# Patient Record
Sex: Female | Born: 1985 | Race: White | Hispanic: No | Marital: Married
Health system: Midwestern US, Community
[De-identification: ages and names within clinical notes are randomized; demographics above are authoritative.]

## PROBLEM LIST (undated history)

## (undated) DIAGNOSIS — K812 Acute cholecystitis with chronic cholecystitis: Secondary | ICD-10-CM

## (undated) DIAGNOSIS — F1910 Other psychoactive substance abuse, uncomplicated: Secondary | ICD-10-CM

## (undated) DIAGNOSIS — G43909 Migraine, unspecified, not intractable, without status migrainosus: Secondary | ICD-10-CM

## (undated) DIAGNOSIS — F329 Major depressive disorder, single episode, unspecified: Secondary | ICD-10-CM

## (undated) DIAGNOSIS — F909 Attention-deficit hyperactivity disorder, unspecified type: Secondary | ICD-10-CM

## (undated) DIAGNOSIS — F32A Depression, unspecified: Secondary | ICD-10-CM

## (undated) HISTORY — PX: TONSILLECTOMY: SUR1361

## (undated) HISTORY — PX: TUBAL LIGATION: SHX77

---
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
## 2004-01-04 ENCOUNTER — Emergency Department (HOSPITAL_COMMUNITY): Admission: EM | Admit: 2004-01-04 | Discharge: 2004-01-04 | Payer: Self-pay | Admitting: Emergency Medicine

## 2004-06-04 ENCOUNTER — Other Ambulatory Visit: Admission: RE | Admit: 2004-06-04 | Discharge: 2004-06-04 | Payer: Self-pay | Admitting: Obstetrics & Gynecology

## 2004-06-10 ENCOUNTER — Emergency Department (HOSPITAL_COMMUNITY): Admission: EM | Admit: 2004-06-10 | Discharge: 2004-06-10 | Payer: Self-pay | Admitting: Emergency Medicine

## 2004-11-13 ENCOUNTER — Other Ambulatory Visit: Admission: RE | Admit: 2004-11-13 | Discharge: 2004-11-13 | Payer: Self-pay | Admitting: Obstetrics & Gynecology

## 2005-01-06 ENCOUNTER — Inpatient Hospital Stay (HOSPITAL_COMMUNITY): Admission: AD | Admit: 2005-01-06 | Discharge: 2005-01-06 | Payer: Self-pay | Admitting: Obstetrics and Gynecology

## 2005-01-30 ENCOUNTER — Inpatient Hospital Stay (HOSPITAL_COMMUNITY): Admission: AD | Admit: 2005-01-30 | Discharge: 2005-01-30 | Payer: Self-pay | Admitting: Obstetrics and Gynecology

## 2005-04-05 ENCOUNTER — Inpatient Hospital Stay (HOSPITAL_COMMUNITY): Admission: AD | Admit: 2005-04-05 | Discharge: 2005-04-07 | Payer: Self-pay | Admitting: Obstetrics & Gynecology

## 2005-04-09 ENCOUNTER — Other Ambulatory Visit: Admission: RE | Admit: 2005-04-09 | Discharge: 2005-04-09 | Payer: Self-pay | Admitting: Obstetrics & Gynecology

## 2005-04-30 ENCOUNTER — Inpatient Hospital Stay (HOSPITAL_COMMUNITY): Admission: AD | Admit: 2005-04-30 | Discharge: 2005-04-30 | Payer: Self-pay | Admitting: Obstetrics & Gynecology

## 2005-05-19 ENCOUNTER — Observation Stay (HOSPITAL_COMMUNITY): Admission: AD | Admit: 2005-05-19 | Discharge: 2005-05-20 | Payer: Self-pay | Admitting: Obstetrics and Gynecology

## 2005-05-29 ENCOUNTER — Observation Stay (HOSPITAL_COMMUNITY): Admission: AD | Admit: 2005-05-29 | Discharge: 2005-05-30 | Payer: Self-pay | Admitting: Obstetrics & Gynecology

## 2005-06-06 ENCOUNTER — Inpatient Hospital Stay (HOSPITAL_COMMUNITY): Admission: AD | Admit: 2005-06-06 | Discharge: 2005-06-06 | Payer: Self-pay | Admitting: Obstetrics and Gynecology

## 2005-06-06 ENCOUNTER — Inpatient Hospital Stay (HOSPITAL_COMMUNITY): Admission: AD | Admit: 2005-06-06 | Discharge: 2005-06-07 | Payer: Self-pay | Admitting: Obstetrics and Gynecology

## 2005-06-13 ENCOUNTER — Inpatient Hospital Stay (HOSPITAL_COMMUNITY): Admission: AD | Admit: 2005-06-13 | Discharge: 2005-06-16 | Payer: Self-pay | Admitting: Obstetrics & Gynecology

## 2006-04-20 ENCOUNTER — Inpatient Hospital Stay (HOSPITAL_COMMUNITY): Admission: AD | Admit: 2006-04-20 | Discharge: 2006-04-20 | Payer: Self-pay | Admitting: Gynecology

## 2006-05-07 ENCOUNTER — Ambulatory Visit (HOSPITAL_COMMUNITY): Admission: RE | Admit: 2006-05-07 | Discharge: 2006-05-07 | Payer: Self-pay | Admitting: Obstetrics and Gynecology

## 2009-06-11 ENCOUNTER — Emergency Department (HOSPITAL_COMMUNITY): Admission: EM | Admit: 2009-06-11 | Discharge: 2009-06-11 | Payer: Self-pay | Admitting: Emergency Medicine

## 2009-07-04 ENCOUNTER — Encounter: Admission: RE | Admit: 2009-07-04 | Discharge: 2009-07-04 | Payer: Self-pay | Admitting: General Practice

## 2009-09-13 ENCOUNTER — Emergency Department (HOSPITAL_BASED_OUTPATIENT_CLINIC_OR_DEPARTMENT_OTHER): Admission: EM | Admit: 2009-09-13 | Discharge: 2009-09-13 | Payer: Self-pay | Admitting: Emergency Medicine

## 2009-09-13 ENCOUNTER — Ambulatory Visit: Payer: Self-pay | Admitting: Diagnostic Radiology

## 2009-10-08 ENCOUNTER — Emergency Department (HOSPITAL_BASED_OUTPATIENT_CLINIC_OR_DEPARTMENT_OTHER): Admission: EM | Admit: 2009-10-08 | Discharge: 2009-10-08 | Payer: Self-pay | Admitting: Emergency Medicine

## 2010-04-05 ENCOUNTER — Emergency Department (HOSPITAL_BASED_OUTPATIENT_CLINIC_OR_DEPARTMENT_OTHER)
Admission: EM | Admit: 2010-04-05 | Discharge: 2010-04-05 | Payer: Self-pay | Source: Home / Self Care | Admitting: Emergency Medicine

## 2010-06-24 LAB — DIFFERENTIAL
Basophils Absolute: 0.2 10*3/uL — ABNORMAL HIGH (ref 0.0–0.1)
Basophils Relative: 2 % — ABNORMAL HIGH (ref 0–1)
Lymphs Abs: 2.2 10*3/uL (ref 0.7–4.0)
Monocytes Relative: 5 % (ref 3–12)

## 2010-06-24 LAB — BASIC METABOLIC PANEL
GFR calc Af Amer: >60 mL/min (ref >60)
Potassium: 4.1 mEq/L (ref 3.5–5.1)

## 2010-06-24 LAB — URINALYSIS, ROUTINE W REFLEX MICROSCOPIC
Bilirubin Urine: NEGATIVE
Glucose, UA: NEGATIVE mg/dL
Ketones, ur: NEGATIVE mg/dL
Nitrite: NEGATIVE
Protein, ur: NEGATIVE mg/dL
Specific Gravity, Urine: 1.025 (ref 1.005–1.030)
Urobilinogen, UA: 0.2 mg/dL (ref 0.0–1.0)
pH: 6 (ref 5.0–8.0)

## 2010-06-24 LAB — URINE MICROSCOPIC-ADD ON

## 2010-06-24 LAB — CBC: RDW: 14 % (ref 11.5–15.5)

## 2010-06-24 LAB — HEMOCCULT GUIAC POC 1CARD (OFFICE): Fecal Occult Bld: NEGATIVE

## 2010-07-17 ENCOUNTER — Emergency Department (HOSPITAL_BASED_OUTPATIENT_CLINIC_OR_DEPARTMENT_OTHER)
Admission: EM | Admit: 2010-07-17 | Discharge: 2010-07-17 | Disposition: A | Discharge status: Home / Self Care | Payer: Medicaid Other | Attending: Emergency Medicine | Admitting: Emergency Medicine

## 2010-07-17 DIAGNOSIS — J3489 Other specified disorders of nose and nasal sinuses: Secondary | ICD-10-CM | POA: Insufficient documentation

## 2010-07-17 DIAGNOSIS — J069 Acute upper respiratory infection, unspecified: Secondary | ICD-10-CM | POA: Insufficient documentation

## 2010-07-17 DIAGNOSIS — F17200 Nicotine dependence, unspecified, uncomplicated: Secondary | ICD-10-CM | POA: Insufficient documentation

## 2010-08-23 NOTE — Discharge Summary (Signed)
NAMESANTINA, Tamara Allen                              ACCOUNT NO.:  [id number]   MEDICAL RECORD NO.:  [id number]                   PATIENT TYPE:  INP   LOCATION:  9114                                 FACILITY:  [facility]   PHYSICIAN:  Gerrit Friends. Aldona Bar, M.D.                DATE OF BIRTH:  April 04, 1986   DATE OF ADMISSION:  08/15/2003  DATE OF DISCHARGE:  08/18/2003                                 DISCHARGE SUMMARY   DISCHARGE DIAGNOSES:  1. Term pregnancy delivered 7-pound 8-ounce female infant Apgars 9 and 9.  2. Blood type A positive.   PROCEDURES:  1. Vacuum extracted assisted delivery.  2. Episiotomy repair.   SUMMARY:  This 25 year old primigravida was admitted in labor after an  antenatal course that was complicated by Chlamydia and an abnormal Pap  smear.  She presented in labor, required Pitocin augmentation, amniotomy and  internal monitoring, she received an epidural, she progressed to full  dilatation and during second stage had trouble with pain control as well as  maternal exhaustion and eventually required a vacuum extractor assisted  delivery over a midline episiotomy, Apgars were 9 and 9, and her postpartum  course was benign.  On the morning of Aug 18, 2003 she was ambulating well,  tolerating a regular diet well and having normal bowel and bladder function,  was bottle-feeding without difficulty, vital signs were stable, and she was  deemed ready for discharge.  Discharge hemoglobin 7.8 with a white count  12,300 and a platelet count of 303,000.   DISCHARGE MEDICATIONS:  1. Vitamins - one a day until gone.  2. Ferrous sulfate 300 mg twice daily.  3. Tylox as needed for severe pain.  4. Motrin as needed for less severe pain.   FOLLOW UP:  She will return to the office in follow up approximately 4 weeks  time or as needed.   CONDITION ON DISCHARGE:  Good.                                               Gerrit Friends. Aldona Bar, M.D.    RMW/MEDQ  D:  08/18/2003  T:  08/18/2003  Job:   [id number]

## 2010-08-23 NOTE — Discharge Summary (Signed)
Tamara Allen, Tamara Allen                  ACCOUNT NO.:  [id number]   MEDICAL RECORD NO.:  [id number]          PATIENT TYPE:  INP   LOCATION:  9169                          FACILITY:  [facility]   PHYSICIAN:  Gerrit Friends. Aldona Bar, M.D.   DATE OF BIRTH:  01-Sep-1985   DATE OF ADMISSION:  05/29/2005  DATE OF DISCHARGE:  05/30/2005                                 DISCHARGE SUMMARY   FINAL DIAGNOSES:  1.  Intrauterine pregnancy at 56 weeks' gestation.  2.  Preterm uterine contractions.  3.  Smoker.   The patient admitted for observation.   COMPLICATIONS:  None.   HISTORY:  This 25 year old G3, P1 presents at 52 and 6/7ths weeks gestation  with contractions that had been progressing all day.  She was seen in the  office.  They were about every 7 minutes.  Her cervix was 1-2 cm dilated,  40% effaced at a -2 station.  The patient's antepartum course up to this  point had been complicated by an anxiety depression disorder.  The patient  also is a smoker.  Her Paps were also being watched throughout secondary to  some low-grade changes.  Treatment, if needed, will be done post partum.  The patient has been on Procardia throughout this pregnancy for some preterm  contractions.  She did have a positive group B strep culture back in October  of 2006.  She is admitted at this time.   HOSPITAL COURSE:  She was started on IV fluids, and expectant management was  used.  The patient's contractions stopped.  She has a cervical check in the  morning with no change her cervix, and at this point was felt ready for  discharge.   DISCHARGE DIET:  She was sent home on a regular diet.   DISCHARGE INSTRUCTIONS:  She was told to come back if her contractions were  every 3-5 minutes apart.  Labor precautions were reviewed, and she was to  follow up in our office in the week.   LABORATORIES ON DISCHARGE:  The patient had a hemoglobin of 9.6, white blood  cell count at 15.8 and platelets of 373,000.     Leilani Able,  P.A.-C.      Gerrit Friends. Aldona Bar, M.D.  Electronically Signed   MB/MEDQ  D:  07/12/2005  T:  07/13/2005  Job:  [id number]

## 2010-08-23 NOTE — Discharge Summary (Signed)
Tamara Allen, Tamara Allen                  ACCOUNT NO.:  [id number]   MEDICAL RECORD NO.:  [id number]          PATIENT TYPE:  INP   LOCATION:  9152                          [facility]ACILITY:  [facility]   PHYSICIAN:  Miguel Aschoff, M.D.       DATE OF BIRTH:  [birth year]   DATE OF ADMISSION:  04/05/2005  DATE OF DISCHARGE:  04/07/2005                                 DISCHARGE SUMMARY   ADMISSION DIAGNOSES:  1.  Intrauterine pregnancy at 28 2/7 weeks.  2.  Nausea, vomiting.  3.  Dehydration.  4.  Migraine headache.   FINAL DIAGNOSES:  1.  Intrauterine pregnancy at 28 2/7 weeks.  2.  Nausea, vomiting.  3.  Dehydration.  4.  Migraine headache.   BRIEF HISTORY:  The patient is a 25 year old white female, gravida 3, para  1, 0-1-1 with an estimated date of confinement of June 27, 2005.  The  patient presents to the emergency room on December 30, 23006 complaining of  nausea and vomiting, as well as severe headache.  The patient was evaluated  in the emergency room and was admitted for intravenous hydration and  management of her headache.   HOSPITAL COURSE:  Laboratory studies were obtained, which revealed a  hemoglobin of 9.67, a white count of 12.4.  Chemistry profile was  essentially within normal limits and fetal fibronectin was negative.  Urinalysis was negative also.  The patient was started on intravenous fluids  and Phenergan for nausea and given intravenous narcotic analgesics for her  headache.  By April 07, 2005, she was feeling somewhat better.  The nausea  had resolved and she was stable enough to be discharged home.   MEDICATIONS FOR HOME:  1.  Demerol 50 mg p.o. q.4h. p.r.n. pain.  2.  Phenergan 25 mg q.6h. as needed for nausea.   She was instructed to keep her visit at the office for April 16, 2005 and  to call if there were any problems, such as increasing pain paresthesias,  fever or worsening of the headache.      Miguel Aschoff, M.D.  Electronically Signed     AR/MEDQ  D:   04/27/2005  T:  04/28/2005  Job:  [id number]

## 2010-08-23 NOTE — H&P (Signed)
NAME:  Tamara Allen, Tamara Allen                  ACCOUNT NO.:  [id number]   MEDICAL RECORD NO.:  [id number]          PATIENT TYPE:  AMB   LOCATION:  DAY                           FACILITY:  [facility]   PHYSICIAN:  Tilda Burrow, M.D. DATE OF BIRTH:  [birth year]   DATE OF ADMISSION:  04/28/2006  DATE OF DISCHARGE:  LH                              HISTORY & PHYSICAL   ADMISSION DIAGNOSES:  Elective permanent sterilization, intrauterine  device scheduled for removal.   HISTORY OF PRESENT ILLNESS:  This 25 year old female gravida 3, para 2,  two previous children, presents with her mother for requested elected  permanent sterilization.  She has been requesting it since October 2007.  She was talked into an IUD at that point in time.  Since that time, she  has complained of persistent spotting.  She signed Medicaid tubal  sterilizations at that time.  At this time, she is called back really  requesting tubal sterilization requesting to see the physician.  She  is firmly and strongly convinced that she wants no further children  under any circumstance.  I had a lengthy and calm conversation with Tamara Allen  today, April 21, 2006, to ensure that she has looked at her plans from  all angles, and she is confident in her decision.  We have painted  scenarios in the future that she might not anticipate.  I have given her  my recommendations that she consider delayed sterilization at a later  date with a long-term, nonpermanent method, such as __Mirena or  Implanon____ and at this time, she declines these options.  She is  alert, intelligent, oriented with no obvious stressors at this time, so  we will proceed as per patient request.  Permanency of procedure  emphasized.  Tamara Allen instructional booklet used as Catering manager.   PAST MEDICAL HISTORY:  Benign.   PAST SURGICAL HISTORY:  Negative.   ALLERGIES:  CODEINE CAUSES NAUSEA AND VOMITING.   SOCIAL HISTORY:  Cigarettes:  One pack per day.  Alcohol:  Rare,  social.  Marijuana:  Done.   PAST SURGICAL HISTORY:  1. Tonsillectomy in the past.  2. Wisdom tooth extraction under anesthesia in the dental office.   PHYSICAL EXAMINATION:  VITAL SIGNS:  Weight 171, blood pressure 104/66,  hemoglobin 11.5.  GENERAL:  A healthy appearing, cheerful, Caucasian female, alert and  oriented x3.  HEENT:  Pupils equal, round and reactive to light.  Extraocular  movements intact.  NECK:  Supple.  Normal thyroid.  CHEST:  Clear to auscultation.  LUNGS:  Clear to auscultation.  ABDOMEN:  Moderate obesity.  EXTERNAL GENITALIA:  Normal vaginal exam.  PHYSIOLOGIC:  Secretions, cervix, IUD string in place.  Uterus anteflex,  nontender.  Adnexa without masses or purulence.  Recent GC/Chlamydia  performed prior to IUD insertion.   PLAN:  Laparoscopic tubal sterilization __________January 28, 2008, with  IUD removed at the same anesthesia.  Preop visit 1:00 p.m., May 01, 2006.      Tilda Burrow, M.D.  Electronically Signed     JVF/MEDQ  D:  04/28/2006  T:  04/29/2006  Job:  [id number]

## 2010-08-23 NOTE — Discharge Summary (Signed)
Tamara Allen, Tamara Allen                  ACCOUNT NO.:  [id number]   MEDICAL RECORD NO.:  [id number]          PATIENT TYPE:  INP   LOCATION:  9160                          FACILITY:  [facility]   PHYSICIAN:  Miguel Aschoff, M.D.       DATE OF BIRTH:  January 27, 1986   DATE OF ADMISSION:  05/19/2005  DATE OF DISCHARGE:  05/20/2005                                 DISCHARGE SUMMARY   ADMITTING DIAGNOSES:  1.  Intrauterine pregnancy at 34 weeks with preterm contractions.  2.  Migraine headache.   FINAL DIAGNOSES:  1.  Intrauterine pregnancy at 34 weeks with preterm contractions.  2.  Migraine headache.   BRIEF HISTORY:  The patient is a 25 year old white female gravida 3, para 1-  0-1-1 at [redacted] weeks gestation who had previously been treated with terbutaline  in an effort to control preterm contractions.  She presents to [facility] now with the onset of contractions breaking through on the  terbutaline.  In addition, the patient was complaining of headache.  She has  a long history of migraine headaches for which she uses Stadol.  The patient  was admitted for observation, placed on IV hydration, given Stadol and  Phenergan for control of her headache.  On evaluation in the morning of  May 20, 2005 she was still noted to be having contractions.  Her  cervical examination, however, was essentially unchanged and she was started  on oral Procardia 20 mg followed by 10 mg every eight hours.  The Procardia  caused arrest of her contractions.  The fetal heart strip remained reactive.  Patient still complained of headache.  The preterm labor being arrested with  her blood pressure being normal, it was felt the patient could go home as  long as she could have medication for her headache.  Therefore, the patient  is being discharged home on Darvocet-N 100 one every four hours as needed  for pain, Procardia 10 mg p.o. q.8h.  She is instructed to return to the  office in one week for follow-up examination, to  call if she should have  renewed contractions more than six in one hour, any vaginal bleeding, or  rupture of membranes, or visual disturbances.      Miguel Aschoff, M.D.  Electronically Signed     AR/MEDQ  D:  05/20/2005  T:  05/20/2005  Job:  [id number]

## 2010-08-23 NOTE — Discharge Summary (Signed)
NAMEJENNALEE, Tamara Allen                  ACCOUNT NO.:  [id number]   MEDICAL RECORD NO.:  [id number]          PATIENT TYPE:  INP   LOCATION:  9154                          [facility]   PHYSICIAN:  Miguel Aschoff, M.D.       DATE OF BIRTH:  1985/06/28   DATE OF ADMISSION:  05/19/2005  DATE OF DISCHARGE:  05/20/2005                                 DISCHARGE SUMMARY   FINAL DIAGNOSES:  1.  Intrauterine pregnancy at [redacted] weeks gestation.  2.  Threatened preterm labor.   COMPLICATIONS:  None.   HISTORY:  This 25 year old G3, P1 presents at [redacted] weeks gestation with  contractions about every 7 minutes apart.  She is afebrile.  Her antepartum  course up to this point had been complicated by history of smoking, and her  Pap was being watched throughout the pregnancy for some low-grade changes.   HOSPITAL COURSE:  She is admitted this time for IV hydration, Stadol.  The  patient was started on oral Procardia 20 mg every 8 hours.  She was  rechecked later on the evening of May 20, 2005.  Contractions had  ceased, and the patient was ready for discharge.   DISCHARGE MEDICATIONS:  1.  Procardia 10 mg every 8 hours for her contractions.  2.  She was given Darvocet-N 100 one every 4 hours as needed for her      headache.   DISCHARGE INSTRUCTIONS:  She was to return to our office in 1 week for a  routine visit, and of course to call if contractions resumed.   LABORATORIES ON DISCHARGE:  The patient had a negative urine.      Leilani Able, P.A.-C.      Miguel Aschoff, M.D.  Electronically Signed    MB/MEDQ  D:  07/12/2005  T:  07/13/2005  Job:  [id number]

## 2010-08-23 NOTE — Op Note (Signed)
NAME:  Tamara Allen, Tamara Allen                  ACCOUNT NO.:  [id number]   MEDICAL RECORD NO.:  [id number]          PATIENT TYPE:  AMB   LOCATION:  DAY                           FACILITY:  [facility]   PHYSICIAN:  Tilda Burrow, M.D. DATE OF BIRTH:  1985-09-13   DATE OF PROCEDURE:  05/07/2006  DATE OF DISCHARGE:                               OPERATIVE REPORT   PREOPERATIVE DIAGNOSIS:  Elective sterilization.   POSTOPERATIVE DIAGNOSIS:  Elective sterilization.  Possible recent  pelvic infection.   OPERATION PERFORMED:  Laparoscopic tubal sterilization with Falope  rings.   SURGEON:  Tilda Burrow, M.D.   ASSISTANTAmie Critchley, CST.   ANESTHESIA:  General.   COMPLICATIONS:  None.   FINDINGS:  Generous retrograde menstruation into the pelvis.  Slightly  dilated distal portion of the tube raising questions of __________  thin, filmy adhesions, particularly on the right side, reaching from the  right tube to a small fibroid on the back aspects of the lower uterine  segment.  No evidence of perihepatic adhesions.   DESCRIPTION OF PROCEDURE:  The patient was taken to the operating room,  prepped and draped in the usual fashion for abdominal and vaginal  procedure with in and out catheterization of the bladder and Hulka  uterine manipulator positioned in the cervix to manipulate the uterus.  The patient was prepped and draped, infraumbilical vertical 1 cm skin  incision was made as well as a transverse suprapubic incision.  The  abdomen was entered first with a Veress needle being careful to orient  it toward the pelvis.  Pneumoperitoneum achieved under 9 to 11 mm  pressure after water droplet test confirmed intraperitoneal location.  The needle had been carefully oriented toward the pelvis while elevating  the abdominal wall.   The laparoscopic trocar was used with direct visualization to enter the  peritoneal cavity and then the visualization showed no evidence of intra-  abdominal tissue  injury.  A suprapubic 8 mm trocar was placed under  direct visualization, and then we proceeded to inspect the pelvis where  photo 1 showed retrograde menstruation and slightly erythematous adnexa.  Photos 2 and 3 showed the tubes and ovaries bilaterally with some  fibrinous erythematous tissues overlying some of the surface of the  ovaries.  There was a thin filmy adhesion in the right side in photo #4.  This was disrupted.  The pelvis was irrigated.  Attention was then  directed to the tubal area and Falope rings were applied to the proximal  portion of the tube on either side.  It was anticipated that tubes were  likely to be patent, given the normal-appearing fimbria though slightly  dilated appearance of the distal portion of the tube itself.  Presence  of retrograde menstruation suggested tubal patency as well.  Falope  rings were applied and percutaneous 0.5% Marcaine with epinephrine  injected into the incarcerated knuckle of tube and just beneath the tube  in the broad ligament.  The patient had 200 mL of saline instilled in  the abdomen to help remove any intraperitoneal gas.  Laparoscopic trocars were removed carefully and then subcuticular 4-0  Dexon used to close the incisions.  The patient tolerated the procedure  well with Steri-Strips positioned on the skin.  Excellent tissue  approximation having been achieved.  Sponge and needle counts were  correct.      Tilda Burrow, M.D.  Electronically Signed     JVF/MEDQ  D:  05/07/2006  T:  05/07/2006  Job:  [id number]

## 2010-08-23 NOTE — Op Note (Signed)
Tamara Allen, Tamara Allen                              ACCOUNT NO.:  [id number]   MEDICAL RECORD NO.:  [id number]                   PATIENT TYPE:  INP   LOCATION:  9114                                 FACILITY:  [facility]   PHYSICIAN:  Randye Lobo, M.D.                DATE OF BIRTH:  May 27, 1985   DATE OF PROCEDURE:  08/16/2003  DATE OF DISCHARGE:  08/18/2003                                 OPERATIVE REPORT   PREOPERATIVE DIAGNOSES:  1. Intrauterine gestation at 23 plus 5 weeks.  2. Maternal exhaustion.  3. Variable fetal heart rate decelerations with rebound tachycardia.   POSTOPERATIVE DIAGNOSES:  1. Intrauterine gestation at 64 plus 5 weeks.  2. Maternal exhaustion.  3. Variable fetal heart rate decelerations with rebound tachycardia.   PROCEDURE:  A vacuum assisted vaginal delivery with midline episiotomy and  repair.   SURGEON:  Randye Lobo, M.D.   ANESTHESIA:  Epidural, local with 1% lidocaine.   ESTIMATED BLOOD LOSS:  400 mL.   COMPLICATIONS:  None.   INDICATIONS FOR PROCEDURE:  The patient is a 25 year old, gravida 1, para 26,  Caucasian female admitted on Aug 15, 2003 at 75 plus [redacted] weeks gestation in  latent phase labor following a prenatal course which was significant for  treatment for chlamydia cervicitis and which was also significant for an  early ultrasound documenting choroid plexus cyst.  The patient's cervix on  examination at the time of her admission was 2 cm with 80% effacement and  the vertex of a -2 station.  The fetal heart rate tracing at that time was  noted to be reactive.  Pitocin augmentation of labor was performed and on  the morning of Aug 16, 2003 artificial rupture of membranes was performed  and clear fluid was noted at which time the cervix was 4 cm dilated.  An  IUPC and a fetal scalp electrode were placed and the patient went onto  complete her active phase of labor after she received an epidural for  anesthesia.  There was some difficulty during  the patient's labor of having  consistent pain relief with the epidural which had to be adjusted and  redosed by the anesthesia team.   The patient did achieve complete dilation and began pushing. Again she was  having difficulty having good pain control with the epidural.  The vertex  was noted to be at the plus 3 station at this point.  The fetus did develop  deep variable decelerations with rebound tachycardia following the  contractions and the patient was developing fatigue.  A discussion was held  with the patient at this time regarding a recommendation for a vacuum  assisted vaginal delivery with an episiotomy and repair. The risks and  benefits were reviewed and she chose to proceed.   FINDINGS:  A viable female was delivered at 74 with Apgar of 9 at 1  minute  and 9 at 5 minutes. There was a nuchal cord x1 reduced.  There was a  significant amount of caput which was noted prior to placement of the vacuum  extractor.  The placenta was noted to be normal with a normal insertion of a  three vessel cord.  The vagina and cervix demonstrated no lacerations and  there was a routine midline episiotomy which was noted.   DESCRIPTION OF PROCEDURE:  The patient was examined and the vertex was noted  to be at least 3 plus station with pushing. The patient had just had her  catheter removed from her bladder prior to this approximately 40 minutes  earlier. Now the vertex at this time was thought to be in a left occiput  anterior position.  The Mighty Vac was applied to the fetal vertex and one  attempt was performed with the Mighty Vac.  The vertex came down slightly  with this. The Mighty Vac was removed and the patient went on to push  spontaneously for another 2-3 contractions prior to reapplying the vacuum  extractor.  Local 1% lidocaine was injected into the perineum and a midline  episiotomy was performed.  Two additional pulls were used with the vacuum  extractor and a viable female was  then delivered in the left occiput  transverse position. The nuchal cord was reduced and the remainder of the  infant was delivered. The cord was doubly clamped and cut and the newborn  was noted to be vigorous.   The placenta was spontaneously removed and the vagina and perineum were  inspected.  A repair was performed in standard fashion with the #0 and 2-0  Vicryl.   The patient did receive Pitocin 20 units intravenously and she was noted to  have good uterine tone.   There were no complications to the procedure. All needle, instrument and  sponge counts were correct.                                               Randye Lobo, M.D.    BES/MEDQ  D:  08/16/2003  T:  08/17/2003  Job:  [id number]

## 2010-09-30 ENCOUNTER — Emergency Department (HOSPITAL_BASED_OUTPATIENT_CLINIC_OR_DEPARTMENT_OTHER)
Admission: EM | Admit: 2010-09-30 | Discharge: 2010-09-30 | Disposition: A | Discharge status: Home / Self Care | Payer: Medicaid Other | Attending: Emergency Medicine | Admitting: Emergency Medicine

## 2010-09-30 DIAGNOSIS — F411 Generalized anxiety disorder: Secondary | ICD-10-CM | POA: Insufficient documentation

## 2010-09-30 DIAGNOSIS — F319 Bipolar disorder, unspecified: Secondary | ICD-10-CM | POA: Insufficient documentation

## 2010-09-30 DIAGNOSIS — F17200 Nicotine dependence, unspecified, uncomplicated: Secondary | ICD-10-CM | POA: Insufficient documentation

## 2010-09-30 LAB — TSH: TSH: 1.495 u[IU]/mL (ref 0.350–4.500)

## 2010-09-30 LAB — CBC
Hemoglobin: 11.3 g/dL — ABNORMAL LOW (ref 12.0–15.0)
MCH: 27.2 pg (ref 26.0–34.0)
RBC: 4.15 MIL/uL (ref 3.87–5.11)
RDW: 15.4 % (ref 11.5–15.5)

## 2010-09-30 LAB — DIFFERENTIAL
Basophils Relative: 0 % (ref 0–1)
Eosinophils Absolute: 0 10*3/uL (ref 0.0–0.7)
Eosinophils Relative: 0 % (ref 0–5)
Lymphs Abs: 3.5 10*3/uL (ref 0.7–4.0)
Monocytes Relative: 8 % (ref 3–12)
Neutro Abs: 7.2 10*3/uL (ref 1.7–7.7)

## 2010-09-30 LAB — BASIC METABOLIC PANEL
CO2: 22 mEq/L (ref 19–32)
Calcium: 9.9 mg/dL (ref 8.4–10.5)
Creatinine, Ser: 0.6 mg/dL (ref 0.50–1.10)
GFR calc Af Amer: >60 mL/min (ref >60)
Potassium: 4 mEq/L (ref 3.5–5.1)

## 2010-10-25 ENCOUNTER — Emergency Department (HOSPITAL_BASED_OUTPATIENT_CLINIC_OR_DEPARTMENT_OTHER)
Admission: EM | Admit: 2010-10-25 | Discharge: 2010-10-26 | Disposition: A | Discharge status: Home / Self Care | Payer: Medicaid Other | Attending: Emergency Medicine | Admitting: Emergency Medicine

## 2010-10-25 DIAGNOSIS — L03317 Cellulitis of buttock: Secondary | ICD-10-CM | POA: Insufficient documentation

## 2010-10-25 DIAGNOSIS — L0231 Cutaneous abscess of buttock: Secondary | ICD-10-CM | POA: Insufficient documentation

## 2010-10-25 DIAGNOSIS — Z8614 Personal history of Methicillin resistant Staphylococcus aureus infection: Secondary | ICD-10-CM | POA: Insufficient documentation

## 2010-10-25 DIAGNOSIS — F17200 Nicotine dependence, unspecified, uncomplicated: Secondary | ICD-10-CM | POA: Insufficient documentation

## 2010-10-26 MED ORDER — LIDOCAINE-EPINEPHRINE 2 %-1:100000 IJ SOLN
INTRAMUSCULAR | Status: AC
  Filled 2010-10-26: qty 1

## 2010-10-26 MED ORDER — OXYCODONE HCL 5 MG PO TABS: 5.0000 mg | ORAL_TABLET | ORAL | Status: AC | PRN

## 2010-10-26 MED ORDER — DOXYCYCLINE HYCLATE 100 MG PO CAPS: 100.0000 mg | ORAL_CAPSULE | Freq: Two times a day (BID) | ORAL | Status: AC

## 2010-10-26 NOTE — ED Notes (Signed)
Pt was sent to restroom to provide urine specimen for testing.
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
## 2010-10-26 NOTE — ED Notes (Signed)
Pt states that she has abscess on her L posterior thigh/buttock area.  Pt states that is very painful to touch increased pain with movement.

## 2010-10-26 NOTE — ED Notes (Signed)
Discharge pending urine pregnancy test.

## 2010-12-17 ENCOUNTER — Encounter (HOSPITAL_BASED_OUTPATIENT_CLINIC_OR_DEPARTMENT_OTHER): Payer: Self-pay | Admitting: *Deleted

## 2010-12-17 ENCOUNTER — Emergency Department (HOSPITAL_BASED_OUTPATIENT_CLINIC_OR_DEPARTMENT_OTHER)
Admission: EM | Admit: 2010-12-17 | Discharge: 2010-12-17 | Disposition: A | Discharge status: Home / Self Care | Payer: Medicaid Other | Attending: Emergency Medicine | Admitting: Emergency Medicine

## 2010-12-17 DIAGNOSIS — K0889 Other specified disorders of teeth and supporting structures: Secondary | ICD-10-CM

## 2010-12-17 DIAGNOSIS — K089 Disorder of teeth and supporting structures, unspecified: Secondary | ICD-10-CM | POA: Insufficient documentation

## 2010-12-17 MED ORDER — HYDROMORPHONE HCL 2 MG PO TABS: 2.0000 mg | ORAL_TABLET | ORAL | Status: DC | PRN

## 2010-12-17 MED ORDER — HYDROMORPHONE HCL 2 MG PO TABS: 2.0000 mg | ORAL_TABLET | ORAL | Status: AC | PRN

## 2010-12-17 MED ORDER — NAPROXEN 500 MG PO TABS: 500.0000 mg | ORAL_TABLET | Freq: Two times a day (BID) | ORAL | Status: DC

## 2010-12-17 MED ORDER — CLINDAMYCIN HCL 150 MG PO CAPS: 150.0000 mg | ORAL_CAPSULE | Freq: Four times a day (QID) | ORAL | Status: AC

## 2010-12-17 MED ORDER — HYDROMORPHONE HCL 2 MG/ML IJ SOLN
2.0000 mg | Freq: Once | INTRAMUSCULAR | Status: AC
  Administered 2010-12-17: 2 mg via INTRAMUSCULAR
  Filled 2010-12-17: qty 1
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
## 2010-12-17 NOTE — ED Notes (Signed)
Pt c/o pain in left lower molar x2 days.

## 2011-01-13 ENCOUNTER — Encounter (HOSPITAL_BASED_OUTPATIENT_CLINIC_OR_DEPARTMENT_OTHER): Payer: Self-pay | Admitting: *Deleted

## 2011-01-13 ENCOUNTER — Emergency Department (HOSPITAL_BASED_OUTPATIENT_CLINIC_OR_DEPARTMENT_OTHER)
Admission: EM | Admit: 2011-01-13 | Discharge: 2011-01-13 | Disposition: A | Discharge status: Home / Self Care | Payer: Self-pay | Attending: Emergency Medicine | Admitting: Emergency Medicine

## 2011-01-13 DIAGNOSIS — K089 Disorder of teeth and supporting structures, unspecified: Secondary | ICD-10-CM | POA: Insufficient documentation

## 2011-01-13 DIAGNOSIS — K0889 Other specified disorders of teeth and supporting structures: Secondary | ICD-10-CM

## 2011-01-13 MED ORDER — KETOROLAC TROMETHAMINE 60 MG/2ML IM SOLN
INTRAMUSCULAR | Status: AC
  Filled 2011-01-13: qty 2

## 2011-01-13 MED ORDER — HYDROCODONE-IBUPROFEN 7.5-200 MG PO TABS: 1.0000 | ORAL_TABLET | Freq: Four times a day (QID) | ORAL | Status: AC | PRN

## 2011-01-13 MED ORDER — CLINDAMYCIN HCL 150 MG PO CAPS: 150.0000 mg | ORAL_CAPSULE | Freq: Four times a day (QID) | ORAL | Status: AC

## 2011-01-13 MED ORDER — KETOROLAC TROMETHAMINE 60 MG/2ML IM SOLN
60.0000 mg | Freq: Once | INTRAMUSCULAR | Status: AC
  Administered 2011-01-13: 60 mg via INTRAMUSCULAR
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
## 2011-01-13 NOTE — ED Notes (Signed)
Pt states toothache. Seen here 2 weeks ago for same.

## 2011-01-13 NOTE — ED Provider Notes (Signed)
Medical screening examination/treatment/procedure(s) were performed by non-physician practitioner and as supervising physician I was immediately available for consultation/collaboration.\  Charles B. Bernette Mayers, MD 01/13/11 1435

## 2011-02-04 ENCOUNTER — Encounter (HOSPITAL_BASED_OUTPATIENT_CLINIC_OR_DEPARTMENT_OTHER): Payer: Self-pay | Admitting: Emergency Medicine

## 2011-02-04 ENCOUNTER — Emergency Department (HOSPITAL_BASED_OUTPATIENT_CLINIC_OR_DEPARTMENT_OTHER)
Admission: EM | Admit: 2011-02-04 | Discharge: 2011-02-04 | Disposition: A | Discharge status: Home / Self Care | Payer: Self-pay | Attending: Emergency Medicine | Admitting: Emergency Medicine

## 2011-02-04 ENCOUNTER — Emergency Department (INDEPENDENT_AMBULATORY_CARE_PROVIDER_SITE_OTHER): Payer: Self-pay

## 2011-02-04 DIAGNOSIS — R079 Chest pain, unspecified: Secondary | ICD-10-CM

## 2011-02-04 DIAGNOSIS — R0602 Shortness of breath: Secondary | ICD-10-CM

## 2011-02-04 DIAGNOSIS — W208XXA Other cause of strike by thrown, projected or falling object, initial encounter: Secondary | ICD-10-CM | POA: Insufficient documentation

## 2011-02-04 DIAGNOSIS — S335XXA Sprain of ligaments of lumbar spine, initial encounter: Secondary | ICD-10-CM | POA: Insufficient documentation

## 2011-02-04 DIAGNOSIS — Y92009 Unspecified place in unspecified non-institutional (private) residence as the place of occurrence of the external cause: Secondary | ICD-10-CM | POA: Insufficient documentation

## 2011-02-04 DIAGNOSIS — S20219A Contusion of unspecified front wall of thorax, initial encounter: Secondary | ICD-10-CM | POA: Insufficient documentation

## 2011-02-04 DIAGNOSIS — S0990XA Unspecified injury of head, initial encounter: Secondary | ICD-10-CM | POA: Insufficient documentation

## 2011-02-04 DIAGNOSIS — S39012A Strain of muscle, fascia and tendon of lower back, initial encounter: Secondary | ICD-10-CM

## 2011-02-04 HISTORY — DX: Migraine, unspecified, not intractable, without status migrainosus: G43.909

## 2011-02-04 MED ORDER — HYDROCODONE-IBUPROFEN 7.5-200 MG PO TABS: 1.0000 | ORAL_TABLET | Freq: Four times a day (QID) | ORAL | Status: AC | PRN

## 2011-02-04 MED ORDER — TRAMADOL HCL 50 MG PO TABS
50.0000 mg | ORAL_TABLET | Freq: Once | ORAL | Status: AC
  Administered 2011-02-04: 50 mg via ORAL
  Filled 2011-02-04: qty 1

## 2011-02-04 NOTE — ED Provider Notes (Signed)
Medical screening examination/treatment/procedure(s) were performed by non-physician practitioner and as supervising physician I was immediately available for consultation/collaboration.   Joya Gaskins, MD 02/04/11 [phone]
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
## 2011-02-04 NOTE — ED Notes (Signed)
Pt c/o LLQ pain, "knot" to LT side head & chest that are painful; also c/o low back pain

## 2011-03-07 ENCOUNTER — Emergency Department (HOSPITAL_BASED_OUTPATIENT_CLINIC_OR_DEPARTMENT_OTHER)
Admission: EM | Admit: 2011-03-07 | Discharge: 2011-03-07 | Disposition: A | Discharge status: Home / Self Care | Payer: Self-pay | Attending: Emergency Medicine | Admitting: Emergency Medicine

## 2011-03-07 ENCOUNTER — Encounter (HOSPITAL_BASED_OUTPATIENT_CLINIC_OR_DEPARTMENT_OTHER): Payer: Self-pay

## 2011-03-07 DIAGNOSIS — G43909 Migraine, unspecified, not intractable, without status migrainosus: Secondary | ICD-10-CM | POA: Insufficient documentation

## 2011-03-07 DIAGNOSIS — F329 Major depressive disorder, single episode, unspecified: Secondary | ICD-10-CM | POA: Insufficient documentation

## 2011-03-07 DIAGNOSIS — F17200 Nicotine dependence, unspecified, uncomplicated: Secondary | ICD-10-CM | POA: Insufficient documentation

## 2011-03-07 DIAGNOSIS — F3289 Other specified depressive episodes: Secondary | ICD-10-CM | POA: Insufficient documentation

## 2011-03-07 HISTORY — DX: Major depressive disorder, single episode, unspecified: F32.9

## 2011-03-07 HISTORY — DX: Depression, unspecified: F32.A

## 2011-03-07 MED ORDER — BUTORPHANOL TARTRATE 2 MG/ML IJ SOLN
2.0000 mg | Freq: Once | INTRAMUSCULAR | Status: DC
  Filled 2011-03-07: qty 1

## 2011-03-07 MED ORDER — KETOROLAC TROMETHAMINE 60 MG/2ML IM SOLN
30.0000 mg | Freq: Once | INTRAMUSCULAR | Status: AC
  Administered 2011-03-07: 30 mg via INTRAMUSCULAR
  Filled 2011-03-07: qty 2

## 2011-03-07 MED ORDER — PROMETHAZINE HCL 25 MG/ML IJ SOLN
25.0000 mg | Freq: Once | INTRAMUSCULAR | Status: AC
  Administered 2011-03-07: 25 mg via INTRAMUSCULAR
  Filled 2011-03-07: qty 1

## 2011-03-07 MED ORDER — CYCLOBENZAPRINE HCL 10 MG PO TABS: 10.0000 mg | ORAL_TABLET | Freq: Two times a day (BID) | ORAL | Status: AC | PRN

## 2011-03-07 MED ORDER — PROMETHAZINE HCL 25 MG RE SUPP: 25.0000 mg | Freq: Four times a day (QID) | RECTAL | Status: AC | PRN

## 2011-03-07 NOTE — ED Notes (Signed)
EDP [place] notified no stadol in pyxis
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
## 2011-03-07 NOTE — ED Notes (Signed)
Pt states that she has a severe migraine.  Pt states that she has taken Aleve for her headache.  No relief.  Pt states that she has vomited twice.

## 2011-04-05 ENCOUNTER — Encounter (HOSPITAL_BASED_OUTPATIENT_CLINIC_OR_DEPARTMENT_OTHER): Payer: Self-pay | Admitting: Emergency Medicine

## 2011-04-05 ENCOUNTER — Emergency Department (HOSPITAL_BASED_OUTPATIENT_CLINIC_OR_DEPARTMENT_OTHER)
Admission: EM | Admit: 2011-04-05 | Discharge: 2011-04-05 | Disposition: A | Discharge status: Home / Self Care | Payer: Self-pay | Attending: Emergency Medicine | Admitting: Emergency Medicine

## 2011-04-05 DIAGNOSIS — S3992XA Unspecified injury of lower back, initial encounter: Secondary | ICD-10-CM

## 2011-04-05 DIAGNOSIS — F17200 Nicotine dependence, unspecified, uncomplicated: Secondary | ICD-10-CM | POA: Insufficient documentation

## 2011-04-05 DIAGNOSIS — S21209A Unspecified open wound of unspecified back wall of thorax without penetration into thoracic cavity, initial encounter: Secondary | ICD-10-CM | POA: Insufficient documentation

## 2011-04-05 DIAGNOSIS — Y92009 Unspecified place in unspecified non-institutional (private) residence as the place of occurrence of the external cause: Secondary | ICD-10-CM | POA: Insufficient documentation

## 2011-04-05 DIAGNOSIS — W108XXA Fall (on) (from) other stairs and steps, initial encounter: Secondary | ICD-10-CM | POA: Insufficient documentation

## 2011-04-05 MED ORDER — OXYCODONE-ACETAMINOPHEN 5-325 MG PO TABS
2.0000 | ORAL_TABLET | Freq: Once | ORAL | Status: AC
  Administered 2011-04-05: 2 via ORAL
  Filled 2011-04-05: qty 2

## 2011-04-05 MED ORDER — KETOROLAC TROMETHAMINE 60 MG/2ML IM SOLN
60.0000 mg | Freq: Once | INTRAMUSCULAR | Status: AC
  Administered 2011-04-05: 60 mg via INTRAMUSCULAR
  Filled 2011-04-05: qty 2

## 2011-04-05 MED ORDER — OXYCODONE-ACETAMINOPHEN 5-325 MG PO TABS: 2.0000 | ORAL_TABLET | Freq: Four times a day (QID) | ORAL | Status: AC | PRN

## 2011-04-05 MED ORDER — IBUPROFEN 800 MG PO TABS: 800.0000 mg | ORAL_TABLET | Freq: Three times a day (TID) | ORAL | Status: AC

## 2011-04-05 NOTE — ED Notes (Signed)
Pt to room 4 in w/c, able to stand and walk to bed, pt reports slip and fall on concrete steps on left hip, reports chronic lbp, states "It's going down my leg now..."
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
## 2011-06-23 ENCOUNTER — Emergency Department (HOSPITAL_BASED_OUTPATIENT_CLINIC_OR_DEPARTMENT_OTHER)
Admission: EM | Admit: 2011-06-23 | Discharge: 2011-06-23 | Disposition: A | Discharge status: Home Health | Payer: Self-pay | Attending: Emergency Medicine | Admitting: Emergency Medicine

## 2011-06-23 ENCOUNTER — Emergency Department (INDEPENDENT_AMBULATORY_CARE_PROVIDER_SITE_OTHER): Payer: Self-pay

## 2011-06-23 ENCOUNTER — Encounter (HOSPITAL_BASED_OUTPATIENT_CLINIC_OR_DEPARTMENT_OTHER): Payer: Self-pay | Admitting: Family Medicine

## 2011-06-23 DIAGNOSIS — S0990XA Unspecified injury of head, initial encounter: Secondary | ICD-10-CM

## 2011-06-23 DIAGNOSIS — R51 Headache: Secondary | ICD-10-CM | POA: Insufficient documentation

## 2011-06-23 DIAGNOSIS — X58XXXA Exposure to other specified factors, initial encounter: Secondary | ICD-10-CM

## 2011-06-23 DIAGNOSIS — S0993XA Unspecified injury of face, initial encounter: Secondary | ICD-10-CM

## 2011-06-23 DIAGNOSIS — R42 Dizziness and giddiness: Secondary | ICD-10-CM | POA: Insufficient documentation

## 2011-06-23 DIAGNOSIS — F17200 Nicotine dependence, unspecified, uncomplicated: Secondary | ICD-10-CM | POA: Insufficient documentation
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
## 2011-06-23 NOTE — Discharge Instructions (Signed)
Return to the ED with any concerns including vomiting, seizure activity, changes in your vision, decreased level of alertness or lethargy, or any other alarming symptoms.  Your CT scan did not show any acute fractures or other injuries.

## 2011-06-23 NOTE — ED Notes (Signed)
Pt acuity increased from 4 to 3 due to plan of care.

## 2011-06-23 NOTE — ED Notes (Signed)
Patient transported to CT 
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
## 2011-06-23 NOTE — ED Notes (Signed)
Patient reports that she can't do the visual acuity screening because she doesn't have her glasses with her.
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
## 2011-07-24 ENCOUNTER — Encounter (HOSPITAL_BASED_OUTPATIENT_CLINIC_OR_DEPARTMENT_OTHER): Payer: Self-pay | Admitting: *Deleted

## 2011-07-24 ENCOUNTER — Emergency Department (HOSPITAL_BASED_OUTPATIENT_CLINIC_OR_DEPARTMENT_OTHER)
Admission: EM | Admit: 2011-07-24 | Discharge: 2011-07-24 | Disposition: A | Discharge status: Other Acute Inpatient Hospital | Payer: Self-pay | Attending: Emergency Medicine | Admitting: Emergency Medicine

## 2011-07-24 DIAGNOSIS — F17200 Nicotine dependence, unspecified, uncomplicated: Secondary | ICD-10-CM | POA: Insufficient documentation

## 2011-07-24 DIAGNOSIS — F1910 Other psychoactive substance abuse, uncomplicated: Secondary | ICD-10-CM

## 2011-07-24 DIAGNOSIS — F1410 Cocaine abuse, uncomplicated: Secondary | ICD-10-CM | POA: Insufficient documentation

## 2011-07-24 DIAGNOSIS — F1110 Opioid abuse, uncomplicated: Secondary | ICD-10-CM | POA: Insufficient documentation

## 2011-07-24 LAB — RAPID URINE DRUG SCREEN, HOSP PERFORMED
Barbiturates: NOT DETECTED
Benzodiazepines: NOT DETECTED
Cocaine: POSITIVE — AB
Opiates: POSITIVE — AB
Tetrahydrocannabinol: NOT DETECTED

## 2011-07-24 LAB — URINALYSIS, ROUTINE W REFLEX MICROSCOPIC
Bilirubin Urine: NEGATIVE
Glucose, UA: NEGATIVE mg/dL
Hgb urine dipstick: NEGATIVE
Ketones, ur: 15 mg/dL — AB
Nitrite: NEGATIVE
Specific Gravity, Urine: 1.031 — ABNORMAL HIGH (ref 1.005–1.030)
pH: 6 (ref 5.0–8.0)

## 2011-07-24 LAB — COMPREHENSIVE METABOLIC PANEL
ALT: 10 U/L (ref 0–35)
AST: 11 U/L (ref 0–37)
Albumin: 4.1 g/dL (ref 3.5–5.2)
Alkaline Phosphatase: 50 U/L (ref 39–117)
BUN: 14 mg/dL (ref 6–23)
Chloride: 110 mEq/L (ref 96–112)
Potassium: 3.2 mEq/L — ABNORMAL LOW (ref 3.5–5.1)
Sodium: 145 mEq/L (ref 135–145)
Total Bilirubin: 0.2 mg/dL — ABNORMAL LOW (ref 0.3–1.2)
Total Protein: 7.1 g/dL (ref 6.0–8.3)

## 2011-07-24 LAB — URINE MICROSCOPIC-ADD ON

## 2011-07-24 LAB — DIFFERENTIAL
Basophils Absolute: 0 10*3/uL (ref 0.0–0.1)
Basophils Relative: 0 % (ref 0–1)
Eosinophils Absolute: 0.1 10*3/uL (ref 0.0–0.7)
Monocytes Relative: 7 % (ref 3–12)
Neutro Abs: 6.7 10*3/uL (ref 1.7–7.7)
Neutrophils Relative %: 58 % (ref 43–77)

## 2011-07-24 LAB — CBC
Hemoglobin: 12.1 g/dL (ref 12.0–15.0)
MCH: 26.1 pg (ref 26.0–34.0)
MCHC: 33.5 g/dL (ref 30.0–36.0)
Platelets: 428 10*3/uL — ABNORMAL HIGH (ref 150–400)

## 2011-07-24 NOTE — ED Notes (Signed)
[facility] team is already here.  Call wasn't recalled.
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
## 2011-07-24 NOTE — Discharge Instructions (Signed)
Substance Abuse  Your exam indicates that you have a problem with substance abuse. Substance abuse is the misuse of alcohol or drugs that causes problems in family life, friendships, and work relationships. Substance abuse is the most important cause of premature illness, disability, and death in our society. It is also the greatest threat to a person's mental and spiritual well being.  Substance abuse can start out in an innocent way, such as social drinking or taking a little extra medication prescribed by your doctor. No one starts out with the intention of becoming an alcoholic or an addict. Substance abuse victims cannot control their use of alcohol or drugs. They may become intoxicated daily or go on weekend binges. Often there is a strong desire to quit, but attempts to stop using often fail. Encounters with law enforcement or conflicts with family members, friends, and work associates are signs of a potential problem.  Recovery is always possible, although the craving for some drugs makes it difficult to quit without assistance. Many treatment programs are available to help people stop abusing alcohol or drugs. The first step in treatment is to admit you have a problem. This is a major hurdle because denial is a powerful force with substance abuse.  Alcoholics Anonymous, Narcotics Anonymous, Cocaine Anonymous, and other recovery groups and programs can be very useful in helping people to quit. If you do not feel okay about your drug or alcohol use and if it is causing you trouble, we want to encourage you to talk about it with your doctor or with someone from a recovery group who can help you. You could also call the National Institute on Drug Abuse at 1-800-662-HELP. It is up to you to take the first step.  [employer] and A[employer]-TEEN are support groups for friends and family members of an alcohol or drug dependent person. The people who love and care for the alcoholic or addicted person often need help, too. For  information about these organizations, check your phone directory or call a local alcohol or drug treatment center.  Document Released: 05/01/2004 Document Revised: 03/13/2011 Document Reviewed: 03/25/2008  [employer] Patient Information 2012 [employer].

## 2011-07-24 NOTE — ED Notes (Signed)
Pt reports that she is wanting to get help with heroin and cocaine abuse. Pt reports that her last usage was this AM.  Pt denies any suicidal/homicidal ideation.

## 2011-11-11 ENCOUNTER — Emergency Department (HOSPITAL_BASED_OUTPATIENT_CLINIC_OR_DEPARTMENT_OTHER)
Admission: EM | Admit: 2011-11-11 | Discharge: 2011-11-11 | Disposition: A | Discharge status: Home / Self Care | Payer: Self-pay | Attending: Emergency Medicine | Admitting: Emergency Medicine

## 2011-11-11 ENCOUNTER — Encounter (HOSPITAL_BASED_OUTPATIENT_CLINIC_OR_DEPARTMENT_OTHER): Payer: Self-pay | Admitting: *Deleted

## 2011-11-11 DIAGNOSIS — F17200 Nicotine dependence, unspecified, uncomplicated: Secondary | ICD-10-CM | POA: Insufficient documentation

## 2011-11-11 DIAGNOSIS — F329 Major depressive disorder, single episode, unspecified: Secondary | ICD-10-CM | POA: Insufficient documentation

## 2011-11-11 DIAGNOSIS — F3289 Other specified depressive episodes: Secondary | ICD-10-CM | POA: Insufficient documentation

## 2011-11-11 DIAGNOSIS — Z880 Allergy status to penicillin: Secondary | ICD-10-CM | POA: Insufficient documentation

## 2011-11-11 DIAGNOSIS — R21 Rash and other nonspecific skin eruption: Secondary | ICD-10-CM | POA: Insufficient documentation

## 2011-11-11 MED ORDER — DEXAMETHASONE SODIUM PHOSPHATE 10 MG/ML IJ SOLN
10.0000 mg | Freq: Once | INTRAMUSCULAR | Status: AC
  Administered 2011-11-11: 10 mg via INTRAVENOUS
  Filled 2011-11-11: qty 1

## 2011-11-11 MED ORDER — PERMETHRIN 5 % EX CREA: TOPICAL_CREAM | CUTANEOUS | Status: AC

## 2011-11-11 MED ORDER — HYDROXYZINE HCL 25 MG PO TABS
25.0000 mg | ORAL_TABLET | Freq: Once | ORAL | Status: AC
  Administered 2011-11-11: 25 mg via ORAL
  Filled 2011-11-11: qty 1

## 2011-11-11 MED ORDER — HYDROXYZINE HCL 25 MG PO TABS: 25.0000 mg | ORAL_TABLET | Freq: Four times a day (QID) | ORAL | Status: AC

## 2011-11-11 MED ORDER — PREDNISONE 20 MG PO TABS: 60.0000 mg | ORAL_TABLET | Freq: Every day | ORAL | Status: DC

## 2011-11-11 NOTE — ED Notes (Signed)
Pt c/o poison ivy x 2 weeks
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
## 2011-11-12 NOTE — ED Provider Notes (Signed)
Medical screening examination/treatment/procedure(s) were performed by non-physician practitioner and as supervising physician I was immediately available for consultation/collaboration.  Zac Torti, MD 11/12/11 1428 

## 2011-12-20 ENCOUNTER — Emergency Department (HOSPITAL_BASED_OUTPATIENT_CLINIC_OR_DEPARTMENT_OTHER)
Admission: EM | Admit: 2011-12-20 | Discharge: 2011-12-20 | Disposition: A | Discharge status: Home / Self Care | Payer: Self-pay | Attending: Emergency Medicine | Admitting: Emergency Medicine

## 2011-12-20 ENCOUNTER — Encounter (HOSPITAL_BASED_OUTPATIENT_CLINIC_OR_DEPARTMENT_OTHER): Payer: Self-pay | Admitting: Emergency Medicine

## 2011-12-20 DIAGNOSIS — F3289 Other specified depressive episodes: Secondary | ICD-10-CM | POA: Insufficient documentation

## 2011-12-20 DIAGNOSIS — F329 Major depressive disorder, single episode, unspecified: Secondary | ICD-10-CM | POA: Insufficient documentation

## 2011-12-20 DIAGNOSIS — Z880 Allergy status to penicillin: Secondary | ICD-10-CM | POA: Insufficient documentation

## 2011-12-20 DIAGNOSIS — Z888 Allergy status to other drugs, medicaments and biological substances status: Secondary | ICD-10-CM | POA: Insufficient documentation

## 2011-12-20 DIAGNOSIS — F17200 Nicotine dependence, unspecified, uncomplicated: Secondary | ICD-10-CM | POA: Insufficient documentation

## 2011-12-20 DIAGNOSIS — H6090 Unspecified otitis externa, unspecified ear: Secondary | ICD-10-CM

## 2011-12-20 DIAGNOSIS — H60399 Other infective otitis externa, unspecified ear: Secondary | ICD-10-CM | POA: Insufficient documentation

## 2011-12-20 DIAGNOSIS — Z881 Allergy status to other antibiotic agents status: Secondary | ICD-10-CM | POA: Insufficient documentation

## 2011-12-20 MED ORDER — TRAMADOL HCL 50 MG PO TABS
ORAL_TABLET | ORAL | Status: AC
  Administered 2011-12-20: 50 mg
  Filled 2011-12-20: qty 1

## 2011-12-20 MED ORDER — NEOMYCIN-POLYMYXIN-HC 3.5-10000-1 OT SUSP: 4.0000 [drp] | Freq: Four times a day (QID) | OTIC | Status: AC
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
## 2011-12-20 NOTE — ED Notes (Signed)
Pt reports ear pain to right ear, she describes a small bump that was noted on outside of ear 2 days again, pain has increased , pt describes pain when swallowing

## 2011-12-22 ENCOUNTER — Encounter (HOSPITAL_BASED_OUTPATIENT_CLINIC_OR_DEPARTMENT_OTHER): Payer: Self-pay | Admitting: *Deleted

## 2011-12-22 ENCOUNTER — Emergency Department (HOSPITAL_BASED_OUTPATIENT_CLINIC_OR_DEPARTMENT_OTHER)
Admission: EM | Admit: 2011-12-22 | Discharge: 2011-12-22 | Disposition: A | Discharge status: Home / Self Care | Payer: Self-pay | Attending: Emergency Medicine | Admitting: Emergency Medicine

## 2011-12-22 DIAGNOSIS — H6090 Unspecified otitis externa, unspecified ear: Secondary | ICD-10-CM

## 2011-12-22 DIAGNOSIS — H60399 Other infective otitis externa, unspecified ear: Secondary | ICD-10-CM | POA: Insufficient documentation

## 2011-12-22 DIAGNOSIS — F17200 Nicotine dependence, unspecified, uncomplicated: Secondary | ICD-10-CM | POA: Insufficient documentation

## 2011-12-22 MED ORDER — NEOMYCIN-POLYMYXIN-HC 3.5-10000-1 OT SUSP: 4.0000 [drp] | Freq: Three times a day (TID) | OTIC | Status: DC

## 2011-12-22 MED ORDER — ANTIPYRINE-BENZOCAINE 5.4-1.4 % OT SOLN: 3.0000 [drp] | Freq: Four times a day (QID) | OTIC | Status: DC | PRN
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
## 2011-12-22 NOTE — ED Notes (Signed)
Pt. Reports she lost her medicine she got the other night.

## 2011-12-22 NOTE — ED Notes (Signed)
PA at bedside.

## 2011-12-22 NOTE — ED Notes (Signed)
Right ear pain. Lost her ear drops she got here a few days ago.

## 2011-12-23 NOTE — ED Provider Notes (Signed)
Medical screening examination/treatment/procedure(s) were performed by non-physician practitioner and as supervising physician I was immediately available for consultation/collaboration.  Hurman Horn, MD 12/23/11 [phone]

## 2012-04-28 ENCOUNTER — Emergency Department (HOSPITAL_COMMUNITY)
Admission: EM | Admit: 2012-04-28 | Discharge: 2012-04-29 | Disposition: A | Discharge status: Home / Self Care | Payer: Self-pay | Attending: Emergency Medicine | Admitting: Emergency Medicine

## 2012-04-28 ENCOUNTER — Emergency Department (HOSPITAL_COMMUNITY): Payer: Self-pay

## 2012-04-28 ENCOUNTER — Encounter (HOSPITAL_COMMUNITY): Payer: Self-pay | Admitting: Emergency Medicine

## 2012-04-28 DIAGNOSIS — Z8659 Personal history of other mental and behavioral disorders: Secondary | ICD-10-CM | POA: Insufficient documentation

## 2012-04-28 DIAGNOSIS — Z8679 Personal history of other diseases of the circulatory system: Secondary | ICD-10-CM | POA: Insufficient documentation

## 2012-04-28 DIAGNOSIS — Z8781 Personal history of (healed) traumatic fracture: Secondary | ICD-10-CM | POA: Insufficient documentation

## 2012-04-28 DIAGNOSIS — Z3202 Encounter for pregnancy test, result negative: Secondary | ICD-10-CM | POA: Insufficient documentation

## 2012-04-28 DIAGNOSIS — K8020 Calculus of gallbladder without cholecystitis without obstruction: Secondary | ICD-10-CM | POA: Insufficient documentation

## 2012-04-28 DIAGNOSIS — F17200 Nicotine dependence, unspecified, uncomplicated: Secondary | ICD-10-CM | POA: Insufficient documentation

## 2012-04-28 DIAGNOSIS — R110 Nausea: Secondary | ICD-10-CM | POA: Insufficient documentation

## 2012-04-28 LAB — CBC WITH DIFFERENTIAL/PLATELET
Basophils Relative: 0 % (ref 0–1)
Eosinophils Absolute: 0.2 10*3/uL (ref 0.0–0.7)
Lymphs Abs: 3.2 10*3/uL (ref 0.7–4.0)
MCH: 28 pg (ref 26.0–34.0)
Neutro Abs: 5 10*3/uL (ref 1.7–7.7)
Neutrophils Relative %: 56 % (ref 43–77)
Platelets: 383 10*3/uL (ref 150–400)
RBC: 4.4 MIL/uL (ref 3.87–5.11)

## 2012-04-28 LAB — URINALYSIS, ROUTINE W REFLEX MICROSCOPIC
Bilirubin Urine: NEGATIVE
Glucose, UA: NEGATIVE mg/dL
Hgb urine dipstick: NEGATIVE
Specific Gravity, Urine: 1.026 (ref 1.005–1.030)
pH: 6.5 (ref 5.0–8.0)

## 2012-04-28 LAB — COMPREHENSIVE METABOLIC PANEL
ALT: 18 U/L (ref 0–35)
Albumin: 3.7 g/dL (ref 3.5–5.2)
Alkaline Phosphatase: 67 U/L (ref 39–117)
Potassium: 3.8 mEq/L (ref 3.5–5.1)
Sodium: 135 mEq/L (ref 135–145)
Total Protein: 7.1 g/dL (ref 6.0–8.3)

## 2012-04-28 MED ORDER — TRAMADOL HCL 50 MG PO TABS
50.0000 mg | ORAL_TABLET | Freq: Once | ORAL | Status: AC
  Administered 2012-04-28: 50 mg via ORAL
  Filled 2012-04-28: qty 1

## 2012-04-28 MED ORDER — GI COCKTAIL ~~LOC~~
30.0000 mL | Freq: Once | ORAL | Status: AC
  Administered 2012-04-28: 30 mL via ORAL
  Filled 2012-04-28: qty 30

## 2012-04-28 MED ORDER — MORPHINE SULFATE 4 MG/ML IJ SOLN
4.0000 mg | Freq: Once | INTRAMUSCULAR | Status: AC
  Administered 2012-04-28: 4 mg via INTRAMUSCULAR
  Filled 2012-04-28: qty 1

## 2012-04-28 MED ORDER — ONDANSETRON 8 MG PO TBDP
8.0000 mg | ORAL_TABLET | Freq: Once | ORAL | Status: AC
  Administered 2012-04-28: 8 mg via ORAL
  Filled 2012-04-28: qty 1
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
## 2012-04-28 NOTE — ED Notes (Signed)
Pt states that for the last 4 days whenever she eats she gets midepigastric pain that radiates through to her back and a bitter taste regurgitates into her mouth. States she has been taking OTC heartburn meds that have not helped.

## 2012-04-29 MED ORDER — HYDROMORPHONE HCL PF 1 MG/ML IJ SOLN
1.0000 mg | Freq: Once | INTRAMUSCULAR | Status: AC
  Administered 2012-04-29: 1 mg via INTRAMUSCULAR
  Filled 2012-04-29: qty 1

## 2012-04-29 MED ORDER — TRAMADOL HCL 50 MG PO TABS: 50.0000 mg | ORAL_TABLET | Freq: Four times a day (QID) | ORAL | Status: DC | PRN

## 2012-04-29 MED ORDER — FAMOTIDINE 20 MG PO TABS: 20.0000 mg | ORAL_TABLET | Freq: Two times a day (BID) | ORAL | Status: DC

## 2012-04-29 NOTE — ED Provider Notes (Signed)
Medical screening examination/treatment/procedure(s) were performed by non-physician practitioner and as supervising physician I was immediately available for consultation/collaboration.    Julis Haubner R Navarre Diana, MD 04/29/12 1744 

## 2012-08-15 ENCOUNTER — Encounter (HOSPITAL_COMMUNITY): Payer: Self-pay | Admitting: Emergency Medicine

## 2012-08-15 ENCOUNTER — Emergency Department (HOSPITAL_COMMUNITY)
Admission: EM | Admit: 2012-08-15 | Discharge: 2012-08-16 | Disposition: A | Discharge status: Home / Self Care | Payer: Self-pay | Attending: Emergency Medicine | Admitting: Emergency Medicine

## 2012-08-15 DIAGNOSIS — F17200 Nicotine dependence, unspecified, uncomplicated: Secondary | ICD-10-CM | POA: Insufficient documentation

## 2012-08-15 DIAGNOSIS — R110 Nausea: Secondary | ICD-10-CM | POA: Insufficient documentation

## 2012-08-15 DIAGNOSIS — R259 Unspecified abnormal involuntary movements: Secondary | ICD-10-CM | POA: Insufficient documentation

## 2012-08-15 DIAGNOSIS — S29019A Strain of muscle and tendon of unspecified wall of thorax, initial encounter: Secondary | ICD-10-CM

## 2012-08-15 DIAGNOSIS — Z880 Allergy status to penicillin: Secondary | ICD-10-CM | POA: Insufficient documentation

## 2012-08-15 DIAGNOSIS — W010XXA Fall on same level from slipping, tripping and stumbling without subsequent striking against object, initial encounter: Secondary | ICD-10-CM | POA: Insufficient documentation

## 2012-08-15 DIAGNOSIS — Z79899 Other long term (current) drug therapy: Secondary | ICD-10-CM | POA: Insufficient documentation

## 2012-08-15 DIAGNOSIS — Y92009 Unspecified place in unspecified non-institutional (private) residence as the place of occurrence of the external cause: Secondary | ICD-10-CM | POA: Insufficient documentation

## 2012-08-15 DIAGNOSIS — Z87828 Personal history of other (healed) physical injury and trauma: Secondary | ICD-10-CM | POA: Insufficient documentation

## 2012-08-15 DIAGNOSIS — Z8679 Personal history of other diseases of the circulatory system: Secondary | ICD-10-CM | POA: Insufficient documentation

## 2012-08-15 DIAGNOSIS — S239XXA Sprain of unspecified parts of thorax, initial encounter: Secondary | ICD-10-CM | POA: Insufficient documentation

## 2012-08-15 DIAGNOSIS — Z8742 Personal history of other diseases of the female genital tract: Secondary | ICD-10-CM | POA: Insufficient documentation

## 2012-08-15 DIAGNOSIS — Y939 Activity, unspecified: Secondary | ICD-10-CM | POA: Insufficient documentation

## 2012-08-15 DIAGNOSIS — Z8659 Personal history of other mental and behavioral disorders: Secondary | ICD-10-CM | POA: Insufficient documentation

## 2012-08-15 MED ORDER — METHOCARBAMOL 750 MG PO TABS: 750.0000 mg | ORAL_TABLET | Freq: Four times a day (QID) | ORAL | Status: DC | PRN

## 2012-08-15 MED ORDER — DIAZEPAM 5 MG PO TABS
10.0000 mg | ORAL_TABLET | Freq: Once | ORAL | Status: AC
  Administered 2012-08-15: 10 mg via ORAL
  Filled 2012-08-15: qty 2

## 2012-08-15 MED ORDER — ONDANSETRON 4 MG PO TBDP
8.0000 mg | ORAL_TABLET | Freq: Once | ORAL | Status: AC
  Administered 2012-08-15: 8 mg via ORAL
  Filled 2012-08-15: qty 2

## 2012-08-15 MED ORDER — NAPROXEN 500 MG PO TABS: 500.0000 mg | ORAL_TABLET | Freq: Two times a day (BID) | ORAL | Status: DC | PRN

## 2012-08-15 MED ORDER — OXYCODONE-ACETAMINOPHEN 5-325 MG PO TABS
2.0000 | ORAL_TABLET | Freq: Once | ORAL | Status: AC
  Administered 2012-08-15: 2 via ORAL
  Filled 2012-08-15: qty 2

## 2012-08-15 NOTE — ED Notes (Signed)
Patient reports having a tubal miscarriage two weeks ago.  Patient also is c/o nausea and "trembling of right leg".
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
## 2012-08-17 NOTE — ED Provider Notes (Signed)
Medical screening examination/treatment/procedure(s) were performed by non-physician practitioner and as supervising physician I was immediately available for consultation/collaboration.  Sunnie Nielsen, MD 08/17/12 [phone]

## 2012-09-29 ENCOUNTER — Encounter (HOSPITAL_COMMUNITY): Payer: Self-pay | Admitting: Emergency Medicine

## 2012-09-29 ENCOUNTER — Emergency Department (HOSPITAL_COMMUNITY): Payer: Self-pay

## 2012-09-29 ENCOUNTER — Inpatient Hospital Stay (HOSPITAL_COMMUNITY)
Admission: EM | Admit: 2012-09-29 | Discharge: 2012-10-01 | DRG: 419 | Disposition: A | Discharge status: Home / Self Care | Payer: MEDICAID | Attending: General Surgery | Admitting: General Surgery

## 2012-09-29 DIAGNOSIS — F329 Major depressive disorder, single episode, unspecified: Secondary | ICD-10-CM | POA: Diagnosis present

## 2012-09-29 DIAGNOSIS — F17200 Nicotine dependence, unspecified, uncomplicated: Secondary | ICD-10-CM | POA: Diagnosis present

## 2012-09-29 DIAGNOSIS — K8000 Calculus of gallbladder with acute cholecystitis without obstruction: Principal | ICD-10-CM | POA: Diagnosis present

## 2012-09-29 DIAGNOSIS — G43909 Migraine, unspecified, not intractable, without status migrainosus: Secondary | ICD-10-CM | POA: Diagnosis present

## 2012-09-29 DIAGNOSIS — F909 Attention-deficit hyperactivity disorder, unspecified type: Secondary | ICD-10-CM | POA: Diagnosis present

## 2012-09-29 DIAGNOSIS — F3289 Other specified depressive episodes: Secondary | ICD-10-CM | POA: Diagnosis present

## 2012-09-29 HISTORY — DX: Attention-deficit hyperactivity disorder, unspecified type: F90.9

## 2012-09-29 HISTORY — DX: Acute cholecystitis with chronic cholecystitis: K81.2

## 2012-09-29 LAB — CBC WITH DIFFERENTIAL/PLATELET
Eosinophils Relative: 1 % (ref 0–5)
HCT: 32 % — ABNORMAL LOW (ref 36.0–46.0)
Lymphocytes Relative: 40 % (ref 12–46)
Lymphs Abs: 4.3 10*3/uL — ABNORMAL HIGH (ref 0.7–4.0)
MCV: 79.2 fL (ref 78.0–100.0)
Monocytes Absolute: 0.8 10*3/uL (ref 0.1–1.0)
Platelets: 449 10*3/uL — ABNORMAL HIGH (ref 150–400)
RBC: 4.04 MIL/uL (ref 3.87–5.11)
WBC: 11 10*3/uL — ABNORMAL HIGH (ref 4.0–10.5)

## 2012-09-29 LAB — COMPREHENSIVE METABOLIC PANEL
ALT: 7 U/L (ref 0–35)
CO2: 24 mEq/L (ref 19–32)
Calcium: 9.1 mg/dL (ref 8.4–10.5)
GFR calc Af Amer: >90 mL/min (ref >90)
GFR calc non Af Amer: >90 mL/min (ref >90)
Glucose, Bld: 100 mg/dL — ABNORMAL HIGH (ref 70–99)
Sodium: 138 mEq/L (ref 135–145)

## 2012-09-29 MED ORDER — MORPHINE SULFATE 4 MG/ML IJ SOLN
4.0000 mg | Freq: Once | INTRAMUSCULAR | Status: AC
  Administered 2012-09-29: 4 mg via INTRAVENOUS

## 2012-09-29 MED ORDER — MORPHINE SULFATE 4 MG/ML IJ SOLN
4.0000 mg | Freq: Once | INTRAMUSCULAR | Status: AC
  Administered 2012-09-30: 4 mg via INTRAVENOUS
  Filled 2012-09-29: qty 1

## 2012-09-29 MED ORDER — MORPHINE SULFATE 4 MG/ML IJ SOLN
4.0000 mg | Freq: Once | INTRAMUSCULAR | Status: DC
  Filled 2012-09-29: qty 1

## 2012-09-29 MED ORDER — ONDANSETRON 8 MG PO TBDP
8.0000 mg | ORAL_TABLET | Freq: Once | ORAL | Status: AC
  Administered 2012-09-29: 8 mg via ORAL
  Filled 2012-09-29: qty 1
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
## 2012-09-29 NOTE — ED Notes (Signed)
Pt dx with gallstones 1mo ago. Pt states that she began having a dull pain this morning that has progressed steadily. Now pt describes pain as sharp and constant. Pt in obvious distress.

## 2012-09-30 ENCOUNTER — Observation Stay (HOSPITAL_COMMUNITY): Payer: Self-pay

## 2012-09-30 ENCOUNTER — Observation Stay (HOSPITAL_COMMUNITY): Payer: Self-pay | Admitting: Anesthesiology

## 2012-09-30 ENCOUNTER — Encounter (HOSPITAL_COMMUNITY): Payer: Self-pay | Admitting: *Deleted

## 2012-09-30 ENCOUNTER — Encounter (HOSPITAL_COMMUNITY): Payer: Self-pay | Admitting: Anesthesiology

## 2012-09-30 ENCOUNTER — Encounter (HOSPITAL_COMMUNITY): Admission: EM | Disposition: A | Discharge status: Home / Self Care | Payer: Self-pay | Source: Home / Self Care

## 2012-09-30 DIAGNOSIS — K8010 Calculus of gallbladder with chronic cholecystitis without obstruction: Secondary | ICD-10-CM

## 2012-09-30 HISTORY — PX: CHOLECYSTECTOMY: SHX55

## 2012-09-30 LAB — URINALYSIS, ROUTINE W REFLEX MICROSCOPIC
Bilirubin Urine: NEGATIVE
Ketones, ur: NEGATIVE mg/dL
Protein, ur: NEGATIVE mg/dL
Urobilinogen, UA: 0.2 mg/dL (ref 0.0–1.0)

## 2012-09-30 LAB — POCT PREGNANCY, URINE: Preg Test, Ur: NEGATIVE

## 2012-09-30 SURGERY — LAPAROSCOPIC CHOLECYSTECTOMY WITH INTRAOPERATIVE CHOLANGIOGRAM
Anesthesia: General | Site: Abdomen | Wound class: Clean Contaminated

## 2012-09-30 MED ORDER — ONDANSETRON HCL 4 MG PO TABS: 4.0000 mg | ORAL_TABLET | Freq: Four times a day (QID) | ORAL | Status: DC | PRN

## 2012-09-30 MED ORDER — METOCLOPRAMIDE HCL 5 MG/ML IJ SOLN
INTRAMUSCULAR | Status: DC | PRN
  Administered 2012-09-30: 10 mg via INTRAVENOUS

## 2012-09-30 MED ORDER — ROCURONIUM BROMIDE 100 MG/10ML IV SOLN
INTRAVENOUS | Status: DC | PRN
  Administered 2012-09-30: 30 mg via INTRAVENOUS

## 2012-09-30 MED ORDER — LACTATED RINGERS IR SOLN
Status: DC | PRN
  Administered 2012-09-30: 1000 mL

## 2012-09-30 MED ORDER — ONDANSETRON HCL 4 MG/2ML IJ SOLN: 4.0000 mg | Freq: Four times a day (QID) | INTRAMUSCULAR | Status: DC | PRN

## 2012-09-30 MED ORDER — IBUPROFEN 800 MG PO TABS: 400.0000 mg | ORAL_TABLET | Freq: Four times a day (QID) | ORAL | Status: DC | PRN

## 2012-09-30 MED ORDER — LACTATED RINGERS IV SOLN
INTRAVENOUS | Status: DC
  Administered 2012-09-30: 1000 mL via INTRAVENOUS

## 2012-09-30 MED ORDER — CIPROFLOXACIN IN D5W 400 MG/200ML IV SOLN
400.0000 mg | Freq: Two times a day (BID) | INTRAVENOUS | Status: AC
  Administered 2012-09-30: 400 mg via INTRAVENOUS
  Filled 2012-09-30: qty 200

## 2012-09-30 MED ORDER — HEPARIN SODIUM (PORCINE) 5000 UNIT/ML IJ SOLN
5000.0000 [IU] | Freq: Three times a day (TID) | INTRAMUSCULAR | Status: DC
  Filled 2012-09-30 (×4): qty 1

## 2012-09-30 MED ORDER — NEOSTIGMINE METHYLSULFATE 1 MG/ML IJ SOLN
INTRAMUSCULAR | Status: DC | PRN
  Administered 2012-09-30: 3 mg via INTRAVENOUS

## 2012-09-30 MED ORDER — KETOROLAC TROMETHAMINE 15 MG/ML IJ SOLN
15.0000 mg | Freq: Four times a day (QID) | INTRAMUSCULAR | Status: DC
  Administered 2012-09-30 – 2012-10-01 (×2): 15 mg via INTRAVENOUS
  Filled 2012-09-30 (×4): qty 1

## 2012-09-30 MED ORDER — HYDROMORPHONE HCL PF 1 MG/ML IJ SOLN
0.2500 mg | INTRAMUSCULAR | Status: DC | PRN
  Administered 2012-09-30 (×4): 0.5 mg via INTRAVENOUS

## 2012-09-30 MED ORDER — NICOTINE 21 MG/24HR TD PT24
21.0000 mg | MEDICATED_PATCH | Freq: Every day | TRANSDERMAL | Status: DC
  Administered 2012-09-30 – 2012-10-01 (×3): 21 mg via TRANSDERMAL
  Filled 2012-09-30 (×3): qty 1

## 2012-09-30 MED ORDER — ONDANSETRON HCL 4 MG/2ML IJ SOLN
4.0000 mg | Freq: Four times a day (QID) | INTRAMUSCULAR | Status: DC | PRN
  Administered 2012-09-30: 4 mg via INTRAVENOUS
  Filled 2012-09-30: qty 2

## 2012-09-30 MED ORDER — HYDROMORPHONE HCL PF 1 MG/ML IJ SOLN
1.0000 mg | INTRAMUSCULAR | Status: DC | PRN
  Administered 2012-09-30 (×2): 2 mg via INTRAVENOUS
  Administered 2012-09-30 (×4): 1 mg via INTRAVENOUS
  Administered 2012-09-30 (×2): 2 mg via INTRAVENOUS
  Administered 2012-09-30: 1 mg via INTRAVENOUS
  Administered 2012-09-30 – 2012-10-01 (×6): 2 mg via INTRAVENOUS
  Filled 2012-09-30: qty 1
  Filled 2012-09-30: qty 2
  Filled 2012-09-30: qty 1
  Filled 2012-09-30 (×3): qty 2
  Filled 2012-09-30 (×2): qty 1
  Filled 2012-09-30 (×3): qty 2
  Filled 2012-09-30: qty 1
  Filled 2012-09-30 (×3): qty 2

## 2012-09-30 MED ORDER — FENTANYL CITRATE 0.05 MG/ML IJ SOLN
INTRAMUSCULAR | Status: DC | PRN
  Administered 2012-09-30 (×5): 50 ug via INTRAVENOUS

## 2012-09-30 MED ORDER — KCL IN DEXTROSE-NACL 20-5-0.9 MEQ/L-%-% IV SOLN
INTRAVENOUS | Status: DC
  Administered 2012-09-30 (×2): via INTRAVENOUS
  Filled 2012-09-30 (×6): qty 1000

## 2012-09-30 MED ORDER — KETOROLAC TROMETHAMINE 30 MG/ML IJ SOLN
15.0000 mg | Freq: Once | INTRAMUSCULAR | Status: AC | PRN
  Administered 2012-09-30: 30 mg via INTRAVENOUS

## 2012-09-30 MED ORDER — LACTATED RINGERS IV SOLN
INTRAVENOUS | Status: DC | PRN
  Administered 2012-09-30 (×2): via INTRAVENOUS

## 2012-09-30 MED ORDER — PROMETHAZINE HCL 25 MG/ML IJ SOLN: 6.2500 mg | INTRAMUSCULAR | Status: DC | PRN

## 2012-09-30 MED ORDER — GLYCOPYRROLATE 0.2 MG/ML IJ SOLN
INTRAMUSCULAR | Status: DC | PRN
  Administered 2012-09-30: 0.4 mg via INTRAVENOUS

## 2012-09-30 MED ORDER — IOHEXOL 300 MG/ML  SOLN
INTRAMUSCULAR | Status: DC | PRN
  Administered 2012-09-30: 7 mL

## 2012-09-30 MED ORDER — FENTANYL CITRATE 0.05 MG/ML IJ SOLN: 25.0000 ug | INTRAMUSCULAR | Status: DC | PRN

## 2012-09-30 MED ORDER — OXYCODONE HCL 5 MG PO TABS
5.0000 mg | ORAL_TABLET | ORAL | Status: DC | PRN
  Administered 2012-10-01: 10 mg via ORAL
  Administered 2012-10-01: 5 mg via ORAL
  Administered 2012-10-01: 10 mg via ORAL
  Filled 2012-09-30: qty 2
  Filled 2012-09-30: qty 1
  Filled 2012-09-30: qty 2

## 2012-09-30 MED ORDER — BUPIVACAINE-EPINEPHRINE (PF) 0.25% -1:200000 IJ SOLN
INTRAMUSCULAR | Status: DC | PRN
  Administered 2012-09-30: 20 mL

## 2012-09-30 MED ORDER — LACTATED RINGERS IV SOLN: INTRAVENOUS | Status: DC

## 2012-09-30 MED ORDER — MIDAZOLAM HCL 5 MG/5ML IJ SOLN
INTRAMUSCULAR | Status: DC | PRN
  Administered 2012-09-30: 2 mg via INTRAVENOUS

## 2012-09-30 MED ORDER — AMPHETAMINE-DEXTROAMPHETAMINE 10 MG PO TABS
30.0000 mg | ORAL_TABLET | Freq: Every day | ORAL | Status: DC
  Administered 2012-09-30 – 2012-10-01 (×2): 30 mg via ORAL
  Filled 2012-09-30: qty 1
  Filled 2012-09-30 (×2): qty 3

## 2012-09-30 MED ORDER — HYDROMORPHONE HCL PF 1 MG/ML IJ SOLN
1.0000 mg | Freq: Once | INTRAMUSCULAR | Status: AC
  Administered 2012-09-30: 1 mg via INTRAVENOUS
  Filled 2012-09-30: qty 1

## 2012-09-30 MED ORDER — SUCCINYLCHOLINE CHLORIDE 20 MG/ML IJ SOLN
INTRAMUSCULAR | Status: DC | PRN
  Administered 2012-09-30: 100 mg via INTRAVENOUS

## 2012-09-30 MED ORDER — PROPOFOL 10 MG/ML IV BOLUS
INTRAVENOUS | Status: DC | PRN
  Administered 2012-09-30: 130 mg via INTRAVENOUS

## 2012-09-30 MED ORDER — CLINDAMYCIN PHOSPHATE 900 MG/50ML IV SOLN
INTRAVENOUS | Status: DC | PRN
  Administered 2012-09-30: 900 mg via INTRAVENOUS

## 2012-09-30 MED ORDER — LIDOCAINE HCL (PF) 1 % IJ SOLN
INTRAMUSCULAR | Status: DC | PRN
  Administered 2012-09-30: 20 mL

## 2012-09-30 MED ORDER — ONDANSETRON HCL 4 MG/2ML IJ SOLN
INTRAMUSCULAR | Status: DC | PRN
  Administered 2012-09-30: 4 mg via INTRAVENOUS

## 2012-09-30 MED ORDER — CIPROFLOXACIN IN D5W 400 MG/200ML IV SOLN
400.0000 mg | Freq: Two times a day (BID) | INTRAVENOUS | Status: DC
  Administered 2012-09-30: 400 mg via INTRAVENOUS
  Filled 2012-09-30 (×2): qty 200

## 2012-09-30 MED ORDER — SODIUM CHLORIDE 0.9 % IR SOLN
Status: DC | PRN
  Administered 2012-09-30: 1000 mL

## 2012-09-30 SURGICAL SUPPLY — 38 items
ADH SKN CLS APL DERMABOND .7 (GAUZE/BANDAGES/DRESSINGS) ×1
APPLIER CLIP ROT 10 11.4 M/L (STAPLE) ×2
APR CLP MED LRG 11.4X10 (STAPLE) ×1
BAG SPEC RTRVL LRG 6X4 10 (ENDOMECHANICALS) ×1
CANISTER SUCTION 2500CC (MISCELLANEOUS) ×2 IMPLANT
CHLORAPREP W/TINT 26ML (MISCELLANEOUS) ×2 IMPLANT
CLIP APPLIE ROT 10 11.4 M/L (STAPLE) ×1 IMPLANT
CLOTH BEACON ORANGE TIMEOUT ST (SAFETY) ×2 IMPLANT
CONT SPECI 4OZ STER CLIK (MISCELLANEOUS) ×1 IMPLANT
COVER MAYO STAND STRL (DRAPES) ×1 IMPLANT
DECANTER SPIKE VIAL GLASS SM (MISCELLANEOUS) ×2 IMPLANT
DERMABOND ADVANCED (GAUZE/BANDAGES/DRESSINGS) ×1
DERMABOND ADVANCED .7 DNX12 (GAUZE/BANDAGES/DRESSINGS) ×1 IMPLANT
DRAPE C-ARM 42X120 X-RAY (DRAPES) ×1 IMPLANT
DRAPE LAPAROSCOPIC ABDOMINAL (DRAPES) ×2 IMPLANT
DRAPE WARM FLUID 44X44 (DRAPE) ×2 IMPLANT
ELECT REM PT RETURN 9FT ADLT (ELECTROSURGICAL) ×2
ELECTRODE REM PT RTRN 9FT ADLT (ELECTROSURGICAL) ×1 IMPLANT
GLOVE BIO SURGEON STRL SZ 6 (GLOVE) ×4 IMPLANT
GLOVE BIOGEL PI IND STRL 7.0 (GLOVE) ×1 IMPLANT
GLOVE BIOGEL PI INDICATOR 7.0 (GLOVE) ×1
GLOVE INDICATOR 6.5 STRL GRN (GLOVE) ×4 IMPLANT
GOWN PREVENTION PLUS XLARGE (GOWN DISPOSABLE) IMPLANT
GOWN PREVENTION PLUS XXLARGE (GOWN DISPOSABLE) ×2 IMPLANT
GOWN STRL NON-REIN LRG LVL3 (GOWN DISPOSABLE) IMPLANT
HEMOSTAT SURGICEL 4X8 (HEMOSTASIS) IMPLANT
KIT BASIN OR (CUSTOM PROCEDURE TRAY) ×2 IMPLANT
POUCH SPECIMEN RETRIEVAL 10MM (ENDOMECHANICALS) ×2 IMPLANT
SET CHOLANGIOGRAPH MIX (MISCELLANEOUS) ×1 IMPLANT
SET IRRIG TUBING LAPAROSCOPIC (IRRIGATION / IRRIGATOR) ×2 IMPLANT
SOLUTION ANTI FOG 6CC (MISCELLANEOUS) ×2 IMPLANT
SUT MNCRL AB 4-0 PS2 18 (SUTURE) ×2 IMPLANT
TOWEL OR 17X26 10 PK STRL BLUE (TOWEL DISPOSABLE) ×4 IMPLANT
TRAY LAP CHOLE (CUSTOM PROCEDURE TRAY) ×2 IMPLANT
TROCAR BLADELESS OPT 5 75 (ENDOMECHANICALS) ×4 IMPLANT
TROCAR XCEL BLUNT TIP 100MML (ENDOMECHANICALS) ×2 IMPLANT
TROCAR XCEL NON-BLD 11X100MML (ENDOMECHANICALS) ×2 IMPLANT
TUBING INSUFFLATION 10FT LAP (TUBING) ×2 IMPLANT

## 2012-09-30 NOTE — Op Note (Signed)
Laparoscopic Cholecystectomy with IOC Procedure Note  Indications: This patient presents with acute cholecystitis and cholelithiasis and will undergo laparoscopic cholecystectomy.  Pre-operative Diagnosis: see above  Post-operative Diagnosis: Same  Surgeon: Almond Lint   Assistants: Karie Soda  Anesthesia: General endotracheal anesthesia and local  Procedure Details  The patient was seen again in the Holding Room. The risks, benefits, complications, treatment options, and expected outcomes were discussed with the patient. The possibilities of  bleeding, recurrent infection, damage to nearby structures, the need for additional procedures, failure to diagnose a condition, the possible need to convert to an open procedure, and creating a complication requiring transfusion or operation were discussed with the patient. The likelihood of improving the patient's symptoms with return to their baseline status is good.    The patient and/or family concurred with the proposed plan, giving informed consent. The site of surgery properly noted. The patient was taken to Operating Room, and the procedure verified as Laparoscopic Cholecystectomy with Intraoperative Cholangiogram. A Time Out was held and the above information confirmed.  Prior to the induction of general anesthesia, antibiotic prophylaxis was administered. General endotracheal anesthesia was then administered and tolerated well. After the induction, the abdomen was prepped with Chloraprep and draped in the sterile fashion. The patient was positioned in the supine position.  Local anesthetic agent was injected into the skin near the umbilicus and an incision made. We dissected down to the abdominal fascia with blunt dissection.  The fascia was incised vertically and we entered the peritoneal cavity bluntly.  A pursestring suture of 0-Vicryl was placed around the fascial opening.  The Hasson cannula was inserted and secured with the stay  suture.  Pneumoperitoneum was then created with CO2 and tolerated well without any adverse changes in the patient's vital signs. An 11-mm port was placed in the subxiphoid position.  Two 5-mm ports were placed in the right upper quadrant. All skin incisions were infiltrated with a local anesthetic agent before making the incision and placing the trocars.   We positioned the patient in reverse Trendelenburg, tilted slightly to the patient's left.  The gallbladder was identified, the fundus grasped and retracted cephalad. Adhesions were lysed bluntly and with the electrocautery where indicated, taking care not to injure any adjacent organs or viscus. The infundibulum was grasped and retracted laterally, exposing the peritoneum overlying the triangle of Calot. This was then divided and exposed in a blunt fashion. A critical view of the cystic duct and cystic artery was obtained.  The cystic duct was clearly identified and bluntly dissected circumferentially. The cystic duct was ligated with a clip distally.   An incision was made in the cystic duct and the [facility] cholangiogram catheter introduced. The catheter was secured using a clip. A cholangiogram was then performed, demonstrating good filling of left and right hepatic ducts, common duct, duodenum, and reflux into the pancreatic duct.  No filling defects were seen.    The cystic duct was then ligated with clips and divided. The cystic artery was identified, dissected free, ligated with clips and divided as well.   The gallbladder was dissected from the liver bed in retrograde fashion with the electrocautery. The gallbladder was removed and placed in an Endocatch bag.  The gallbladder and Endocatch bag were then removed through the umbilical port site.  The stones were so large that the fascial and skin incisions had to be extended in order to remove the gallbladder.  The liver bed was irrigated and inspected. Hemostasis was achieved with the  electrocautery.  Copious irrigation was utilized and was repeatedly aspirated until clear.    We again inspected the right upper quadrant for hemostasis.  Pneumoperitoneum was released as we removed the trocars.   The pursestring suture was used to close the umbilical fascia.  An additional 0-0 vicryl was placed to close the umbilical fascia.  4-0 Monocryl was used to close the skin.   The skin was cleaned and dry, and Dermabond was applied. The patient was then extubated and brought to the recovery room in stable condition. Instrument, sponge, and needle counts were correct at closure and at the conclusion of the case.   Findings: Acute inflammation.    Estimated Blood Loss: min         Drains: none          Specimens: Gallbladder to pathology       Complications: None; patient tolerated the procedure well.         Disposition: PACU - hemodynamically stable.         Condition: stable

## 2012-09-30 NOTE — Preoperative (Signed)
Beta Blockers   Reason not to administer Beta Blockers:Not Applicable 

## 2012-09-30 NOTE — Transfer of Care (Signed)
Immediate Anesthesia Transfer of Care Note  Patient: Tamara Allen  Procedure(s) Performed: Procedure(s) (LRB): LAPAROSCOPIC CHOLECYSTECTOMY WITH INTRAOPERATIVE CHOLANGIOGRAM (N/A)  Patient Location: PACU  Anesthesia Type: General  Level of Consciousness: sedated, patient cooperative and responds to stimulaton  Airway & Oxygen Therapy: Patient Spontanous Breathing and Patient connected to face mask oxgen  Post-op Assessment: Report given to PACU RN and Post -op Vital signs reviewed and stable  Post vital signs: Reviewed and stable  Complications: No apparent anesthesia complications

## 2012-09-30 NOTE — Plan of Care (Signed)
Problem: Diagnosis - Type of Surgery Goal: General Surgical Patient Education (See Patient Education module for education specifics) Outcome: Progressing Pt instructed on pre-op procedures: being npo, having IV fluids, antibiotics. Pt voiced understanding of these.  Problem: Consults Goal: Nutrition Consult-if indicated Outcome: Progressing Pt is to be npo until after surgery.

## 2012-09-30 NOTE — Progress Notes (Signed)
Having a lot of pain.    Plan lap chole with probable cholangiogram.  Discussed risks and benefits with patient.

## 2012-09-30 NOTE — ED Provider Notes (Signed)
Medical screening examination/treatment/procedure(s) were performed by non-physician practitioner and as supervising physician I was immediately available for consultation/collaboration.   Nelia Shi, MD 09/30/12 [phone]
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
## 2012-09-30 NOTE — Anesthesia Postprocedure Evaluation (Signed)
  Anesthesia Post-op Note  Patient: Tamara Allen  Procedure(s) Performed: Procedure(s) (LRB): LAPAROSCOPIC CHOLECYSTECTOMY WITH INTRAOPERATIVE CHOLANGIOGRAM (N/A)  Patient Location: PACU  Anesthesia Type: General  Level of Consciousness: awake and alert   Airway and Oxygen Therapy: Patient Spontanous Breathing  Post-op Pain: mild  Post-op Assessment: Post-op Vital signs reviewed, Patient's Cardiovascular Status Stable, Respiratory Function Stable, Patent Airway and No signs of Nausea or vomiting  Last Vitals:  Filed Vitals:   09/30/12 1339  BP: 95/60  Pulse: 89  Temp: 36.9 C  Resp: 16    Post-op Vital Signs: stable   Complications: No apparent anesthesia complications

## 2012-09-30 NOTE — Anesthesia Preprocedure Evaluation (Signed)
Anesthesia Evaluation  Patient identified by MRN, date of birth, ID band Patient awake    Reviewed: Allergy & Precautions, H&P , NPO status , Patient's Chart, lab work & pertinent test results  Airway Mallampati: II TM Distance: >3 FB Neck ROM: Full    Dental no notable dental hx.    Pulmonary Current Smoker,  breath sounds clear to auscultation  Pulmonary exam normal       Cardiovascular negative cardio ROS  Rhythm:Regular Rate:Normal     Neuro/Psych negative neurological ROS  negative psych ROS   GI/Hepatic negative GI ROS, Neg liver ROS,   Endo/Other  negative endocrine ROS  Renal/GU negative Renal ROS  negative genitourinary   Musculoskeletal negative musculoskeletal ROS (+)   Abdominal   Peds negative pediatric ROS (+)  Hematology negative hematology ROS (+)   Anesthesia Other Findings   Reproductive/Obstetrics negative OB ROS                          Anesthesia Physical Anesthesia Plan  ASA: II  Anesthesia Plan: General   Post-op Pain Management:    Induction: Intravenous  Airway Management Planned: Oral ETT  Additional Equipment:   Intra-op Plan:   Post-operative Plan: Extubation in OR  Informed Consent: I have reviewed the patients History and Physical, chart, labs and discussed the procedure including the risks, benefits and alternatives for the proposed anesthesia with the patient or authorized representative who has indicated his/her understanding and acceptance.   Dental advisory given  Plan Discussed with: CRNA and Surgeon  Anesthesia Plan Comments:         Anesthesia Quick Evaluation  
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
## 2012-09-30 NOTE — Progress Notes (Signed)
Pt returned from surgery.

## 2012-09-30 NOTE — Progress Notes (Signed)
Pt very lethargic and drowsy from pain meds, nodding off. Per patient request we can speak to April Gallant (sister) to verify consent for surgical procedure. April Gallant stated that she has heard patient state that she wanted her gallbladder out. Consent obtained.  Heaven Meeker A

## 2012-10-01 ENCOUNTER — Encounter (HOSPITAL_COMMUNITY): Payer: Self-pay | Admitting: General Surgery

## 2012-10-01 LAB — BASIC METABOLIC PANEL
BUN: 7 mg/dL (ref 6–23)
Calcium: 8.5 mg/dL (ref 8.4–10.5)
GFR calc non Af Amer: >90 mL/min (ref >90)
Glucose, Bld: 95 mg/dL (ref 70–99)
Sodium: 138 mEq/L (ref 135–145)

## 2012-10-01 LAB — CBC
Hemoglobin: 9.8 g/dL — ABNORMAL LOW (ref 12.0–15.0)
MCH: 26.1 pg (ref 26.0–34.0)
MCHC: 32 g/dL (ref 30.0–36.0)

## 2012-10-01 MED ORDER — OXYCODONE HCL 5 MG PO TABS: 5.0000 mg | ORAL_TABLET | ORAL | Status: DC | PRN

## 2012-10-01 MED ORDER — KETOROLAC TROMETHAMINE 30 MG/ML IJ SOLN
30.0000 mg | Freq: Three times a day (TID) | INTRAMUSCULAR | Status: DC
  Filled 2012-10-01: qty 1

## 2012-10-01 MED ORDER — KETOROLAC TROMETHAMINE 30 MG/ML IJ SOLN
30.0000 mg | Freq: Three times a day (TID) | INTRAMUSCULAR | Status: DC
  Administered 2012-10-01: 30 mg via INTRAVENOUS

## 2012-10-01 NOTE — Discharge Summary (Signed)
Seen, agree with above.   

## 2012-10-01 NOTE — Discharge Summary (Signed)
Patient ID: TIOMBE TOMEO MRN: [id number] DOB/AGE: 11-27-1985 27 y.o.  Admit date: 09/29/2012 Discharge date: 10/01/2012  Procedures: laparoscopic cholecystectomy with IOC  Consults: None  Reason for Admission: patient is a 27 year old female who was diagnosed with gallstones earlier this year after an episode of right upper quadrant pain. She apparently was not referred for surgery at that time. She's continued to have intermittent right upper quadrant abdominal pain in recent months and has not been particularly severe and she did not seek further attention. However 24-hour ago she developed the onset of much more severe constant right upper quadrant pain which she describes as burning and pressure. It has been very severe. It is unrelenting in the emergency room tonight despite pain medication. She has been nauseated without vomiting. No fever chills or jaundice.   Admission Diagnoses:  1. Acute early cholecystitis 2. Migraines 3. Depression 4. ADHD  Hospital Course: The patient was admitted and taken to the operating room the following day.  She underwent a lap chole with no immediate complications.  The following day she c/o soreness, but was otherwise doing well.  She was tolerating a regular diet and pain was controlled with oral pain medications.  She was stable for dc home.  PE: Abd: soft, appropriately tender, +BS, ND, incisions c/d/i with small ecchymosis around umbilical incision  Discharge Diagnoses:  1. Early cholecystitis, s/p lap chole 2. Migraines 3. Depression 4. ADHD  Discharge Medications:   Medication List    TAKE these medications       amphetamine-dextroamphetamine 30 MG tablet  Commonly known as:  ADDERALL  Take 30 mg by mouth daily.     ibuprofen 200 MG tablet  Commonly known as:  ADVIL,MOTRIN  Take 400 mg by mouth every 6 (six) hours as needed for pain.     oxyCODONE 5 MG immediate release tablet  Commonly known as:  Oxy IR/ROXICODONE  Take 1-2  tablets (5-10 mg total) by mouth every 4 (four) hours as needed.        Discharge Instructions:     Follow-up Information   Follow up with [facility] On 10/26/2012. (11:15am, arrive at 10:45am)    Contact information:   [address]   [place] [address] [phone]       Signed: Letha Cape 10/01/2012, 8:11 AM

## 2012-10-01 NOTE — Progress Notes (Signed)
Patient discharge ambulatory,discharge instructions and follow up appointment done and was given to the patient. PIV removed no s/s of infiltration or swelling noted. Abdominal incisions intact no s/s of infection upon discharge.

## 2012-10-26 ENCOUNTER — Encounter (INDEPENDENT_AMBULATORY_CARE_PROVIDER_SITE_OTHER): Payer: Self-pay

## 2013-02-17 ENCOUNTER — Emergency Department: Payer: Self-pay | Admitting: Emergency Medicine

## 2013-04-05 ENCOUNTER — Emergency Department: Payer: Self-pay | Admitting: Emergency Medicine

## 2013-06-06 ENCOUNTER — Emergency Department: Payer: Self-pay | Admitting: Emergency Medicine

## 2013-06-06 LAB — URINALYSIS, COMPLETE
BACTERIA: NONE SEEN
Bilirubin,UR: NEGATIVE
GLUCOSE, UR: NEGATIVE mg/dL (ref 0–75)
Ketone: NEGATIVE
Leukocyte Esterase: NEGATIVE
Nitrite: NEGATIVE
PH: 6 (ref 4.5–8.0)
RBC,UR: 596 /HPF (ref 0–5)
SPECIFIC GRAVITY: 1.024 (ref 1.003–1.030)
WBC UR: 1 /HPF (ref 0–5)

## 2013-06-06 LAB — CBC
HCT: 30.8 % — AB (ref 35.0–47.0)
HGB: 9.8 g/dL — ABNORMAL LOW (ref 12.0–16.0)
MCH: 22.6 pg — AB (ref 26.0–34.0)
MCHC: 31.8 g/dL — ABNORMAL LOW (ref 32.0–36.0)
MCV: 71 fL — AB (ref 80–100)
PLATELETS: 483 10*3/uL — AB (ref 150–440)
RBC: 4.33 10*6/uL (ref 3.80–5.20)
RDW: 17.8 % — ABNORMAL HIGH (ref 11.5–14.5)
WBC: 10.2 10*3/uL (ref 3.6–11.0)

## 2013-06-06 LAB — PREGNANCY, URINE: Pregnancy Test, Urine: NEGATIVE m[IU]/mL

## 2013-06-06 LAB — WET PREP, GENITAL

## 2013-06-06 LAB — GC/CHLAMYDIA PROBE AMP

## 2013-08-20 ENCOUNTER — Emergency Department: Payer: Self-pay | Admitting: Emergency Medicine

## 2013-08-20 LAB — URINALYSIS, COMPLETE
Bilirubin,UR: NEGATIVE
Glucose,UR: NEGATIVE mg/dL (ref 0–75)
KETONE: NEGATIVE
NITRITE: NEGATIVE
PH: 6 (ref 4.5–8.0)
Specific Gravity: 1.011 (ref 1.003–1.030)
Squamous Epithelial: 2
WBC UR: 2231 /HPF (ref 0–5)

## 2013-09-11 ENCOUNTER — Emergency Department: Payer: Self-pay | Admitting: Emergency Medicine

## 2014-02-06 ENCOUNTER — Encounter (HOSPITAL_COMMUNITY): Payer: Self-pay | Admitting: General Surgery

## 2015-09-11 ENCOUNTER — Encounter (HOSPITAL_COMMUNITY): Payer: Self-pay | Admitting: *Deleted

## 2015-09-11 ENCOUNTER — Emergency Department (HOSPITAL_COMMUNITY)
Admission: EM | Admit: 2015-09-11 | Discharge: 2015-09-11 | Disposition: A | Discharge status: Home / Self Care | Payer: Medicaid Other | Attending: Emergency Medicine | Admitting: Emergency Medicine

## 2015-09-11 DIAGNOSIS — T40601A Poisoning by unspecified narcotics, accidental (unintentional), initial encounter: Secondary | ICD-10-CM

## 2015-09-11 DIAGNOSIS — F17210 Nicotine dependence, cigarettes, uncomplicated: Secondary | ICD-10-CM | POA: Insufficient documentation

## 2015-09-11 DIAGNOSIS — F909 Attention-deficit hyperactivity disorder, unspecified type: Secondary | ICD-10-CM | POA: Insufficient documentation

## 2015-09-11 DIAGNOSIS — T401X1A Poisoning by heroin, accidental (unintentional), initial encounter: Secondary | ICD-10-CM | POA: Insufficient documentation

## 2015-09-11 LAB — CBC
HCT: 33.4 % — ABNORMAL LOW (ref 36.0–46.0)
Hemoglobin: 10.1 g/dL — ABNORMAL LOW (ref 12.0–15.0)
MCH: 22.6 pg — AB (ref 26.0–34.0)
MCHC: 30.2 g/dL (ref 30.0–36.0)
MCV: 74.9 fL — ABNORMAL LOW (ref 78.0–100.0)
PLATELETS: 479 10*3/uL — AB (ref 150–400)
RBC: 4.46 MIL/uL (ref 3.87–5.11)
RDW: 17.8 % — ABNORMAL HIGH (ref 11.5–15.5)
WBC: 12.9 10*3/uL — AB (ref 4.0–10.5)

## 2015-09-11 LAB — COMPREHENSIVE METABOLIC PANEL
ALK PHOS: 141 U/L — AB (ref 38–126)
ALT: 26 U/L (ref 14–54)
ANION GAP: 7 (ref 5–15)
AST: 23 U/L (ref 15–41)
Albumin: 4.1 g/dL (ref 3.5–5.0)
BILIRUBIN TOTAL: 0.2 mg/dL — AB (ref 0.3–1.2)
BUN: 13 mg/dL (ref 6–20)
CALCIUM: 9.1 mg/dL (ref 8.9–10.3)
CO2: 22 mmol/L (ref 22–32)
Chloride: 111 mmol/L (ref 101–111)
Creatinine, Ser: 0.66 mg/dL (ref 0.44–1.00)
Glucose, Bld: 99 mg/dL (ref 65–99)
Potassium: 3.5 mmol/L (ref 3.5–5.1)
Sodium: 140 mmol/L (ref 135–145)
TOTAL PROTEIN: 7.8 g/dL (ref 6.5–8.1)

## 2015-09-11 LAB — RAPID URINE DRUG SCREEN, HOSP PERFORMED
AMPHETAMINES: NOT DETECTED
BENZODIAZEPINES: NOT DETECTED
Barbiturates: NOT DETECTED
Cocaine: POSITIVE — AB
OPIATES: NOT DETECTED
Tetrahydrocannabinol: NOT DETECTED

## 2015-09-11 LAB — PREGNANCY, URINE: Preg Test, Ur: NEGATIVE

## 2015-09-11 LAB — ETHANOL: Alcohol, Ethyl (B): <5 mg/dL (ref <5)

## 2015-09-11 MED ORDER — LORAZEPAM 2 MG/ML IJ SOLN: 1.0000 mg | INTRAMUSCULAR | Status: DC | PRN

## 2015-09-11 MED ORDER — DICYCLOMINE HCL 10 MG PO CAPS
10.0000 mg | ORAL_CAPSULE | Freq: Once | ORAL | Status: AC
  Administered 2015-09-11: 10 mg via ORAL
  Filled 2015-09-11: qty 1

## 2015-09-11 MED ORDER — CLONIDINE HCL 0.1 MG PO TABS
0.1000 mg | ORAL_TABLET | Freq: Once | ORAL | Status: AC
  Administered 2015-09-11: 0.1 mg via ORAL
  Filled 2015-09-11: qty 1

## 2015-09-11 MED ORDER — ONDANSETRON HCL 4 MG/2ML IJ SOLN
4.0000 mg | Freq: Once | INTRAMUSCULAR | Status: AC
  Administered 2015-09-11: 4 mg via INTRAVENOUS
  Filled 2015-09-11: qty 2

## 2015-09-11 NOTE — ED Notes (Signed)
RN drawing labs 

## 2015-09-11 NOTE — ED Notes (Signed)
Pt cannot use restroom at this time, aware urine specimen is needed.  

## 2015-09-11 NOTE — ED Notes (Signed)
Per [facility], pt snorted 0.5 gm of heroin approximately 22 hrs ago, was sinus tach, CBG of 157.  Fire dept gave narcan 2 mg intranasally, received narcan 2 mg IM by EMS, zofran 4 mg also given.  Pt continues to c/o nausea.

## 2015-09-11 NOTE — ED Notes (Signed)
MD at bedside. 

## 2015-09-11 NOTE — ED Notes (Addendum)
Pt stated "I've been clean for 8 days.  I was feeling sick, met up with someone and they said why don't you do this.  I've been living in a motel."  Pt is A&O x 4.  Pt remains on monitor

## 2015-09-11 NOTE — ED Notes (Signed)
Pt phone number is [phone]

## 2015-09-11 NOTE — ED Notes (Signed)
Bed: RESA Expected date:  Expected time:  Means of arrival:  Comments:  od
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
## 2015-09-11 NOTE — Discharge Instructions (Signed)
Directly to your reserved a bed at the center arranged by your family. Medically clear for inpatient, or outpatient treatment program     Substance Abuse Treatment Programs  Intensive Outpatient Programs [facility]     [address]      [place], [place]                   [phone]       [facility] [address] [place], [place] [phone]  [facility]     (Inpatient and outpatient)     [address]           [phone]    [facility] [phone] (Suboxone and Methadone)  [address]      [place], [place] [address]      [phone]       [address] [place], [place] [phone]  [facility] (Outpatient/Inpatient, Chemical)    (insurance only) [phone]             [facility] (Groups & Residential) [place], [place] [phone]     [facility]     [address]     [place], [place]      [phone]       [facility] (for caregivers and family) [phone] [address] [place], [place] [phone]      Residential Treatment Programs [address] [facility]      [address] [place], [place] [address]  [phone]       [facility] [address] [place], [place] [address] [phone]  [facility] [place]        [phone]       [facility] [phone]  [facility] ([facility]             [address]                                         [place], [place]                                                [phone] or [phone]                               [facility] [address] [place], [address] [phone]  [facility]    [address]      [place], [place]     [phone]       [facility] [address] [place], [place] [phone]  [facility]   [address] [place], [place] [address]     [phone]      Admissions: 8am-3pm M-F  [facility] (RTS) [address] [place], [place] [phone]  [facility]: Residential Program [phone] Days)   [place], [place]      [phone] or [phone]     [facility]: [facility] [place], [place] (Walk in Hours over the weekend or by referral)  [facility] [address] [place], [place], [place] [address] [phone]  Crisis Mobile: [facility]:  [phone] (  for crisis response 24 hours a day) [facility] Hotline:      [phone] Outpatient Psychiatry and Counseling  [facility]: Mobile Crisis Management 24 hours:  [phone]  [facility] sliding scale fee and walk in schedule: M-F 8am-12pm/1pm-3pm [address]  [place], [place] [address] [phone]  [facility] [address] [place], [place] [address] [phone]  [facility] ([facility] new patient walk-in appointments available Monday - Friday 8am -3pm.          [address] [place], [place] [address] [phone] or crisis line- [phone]  [facility]/ Intensive Outpatient Therapy Program [address] [place], [place] [address] [phone]  [facility]                  Crisis Services      [phone]      [address]     [place], [place] [address]                 [facility]   [facility] [phone] [address] [place], [place] [address]   [facility]          [address] [place],  [place], [place] [address]       [phone]  [facility] [address] [place], [place] [address] [phone]  [facility]    [address]    [place], [place] [address]     [phone]       Andee PolesParish McKinney,  MD     [address]     [place] [address]     [phone]       [facility] [address] [place] [address]  [facility]     [address]     [place], [place]      [phone]       [facility] Eulogio DitchShamsher Ahluwalia, MD [address] [place], [place] [address] [phone]  [facility]     [address]     [place], [place] [address]     [phone]       [facility] [address] [place], [place] [address] [phone] Resources for Temporary Residential Assistance/Crisis Centers  [facility]) M-F 8am-3pm   [address] [place], [place] [address]   [phone] Services include: laundry, barbering, support groups, case management, phone  & computer access, showers, AA/NA mtgs, mental health/substance abuse nurse, job skills class, disability information, VA assistance, spiritual classes, etc.   HOMELESS SHELTERS  [facility]     [facility]   [address], [place]     [phone]              [facility] (women and children)       [address] [place], [place] [address] [phone] [email or web address] for application and process Application Required  [facility]   [address]    [place] [address]     [phone]                    [facility] [address] [place], [place] [address] [phone](schedule application appt.) Application Required  [facility] (women  only)    [address]     [place], [place] [address]     [phone]      Intake starts 6pm daily Need valid ID, SSC, & Police report [employer] [address] [place], [place] [phone] Application Required  [facility] (men only)     [address]      [place],  [place]     [phone]       [facility] (Pregnant women only) [address] [place], [place] [phone]  [facility]      [address]      [place], [place] [address]     [phone]             [facility] [address] [place], [place] [phone] 90 day commitment/SA/Application process  [facility](men only)     [address]     [place], [place]     [phone]       Check-in at [facility] [address] [place], [place] [address] [phone] Men/Women/Women and Children must be there by 7 pm  [facility] [place], [place] [phone]

## 2015-09-12 LAB — GC/CHLAMYDIA PROBE AMP (~~LOC~~) NOT AT ARMC
CHLAMYDIA, DNA PROBE: NEGATIVE
NEISSERIA GONORRHEA: NEGATIVE

## 2015-09-12 LAB — HIV ANTIBODY (ROUTINE TESTING W REFLEX): HIV Screen 4th Generation wRfx: NONREACTIVE

## 2016-08-17 ENCOUNTER — Emergency Department: Payer: Self-pay

## 2016-08-17 ENCOUNTER — Emergency Department
Admission: EM | Admit: 2016-08-17 | Discharge: 2016-08-17 | Disposition: A | Discharge status: Home / Self Care | Payer: Self-pay | Attending: Student in an Organized Health Care Education/Training Program | Admitting: Student in an Organized Health Care Education/Training Program

## 2016-08-17 ENCOUNTER — Encounter: Payer: Self-pay | Admitting: Emergency Medicine

## 2016-08-17 DIAGNOSIS — G9340 Encephalopathy, unspecified: Secondary | ICD-10-CM

## 2016-08-17 DIAGNOSIS — O99320 Drug use complicating pregnancy, unspecified trimester: Secondary | ICD-10-CM

## 2016-08-17 DIAGNOSIS — M79672 Pain in left foot: Secondary | ICD-10-CM | POA: Insufficient documentation

## 2016-08-17 DIAGNOSIS — F909 Attention-deficit hyperactivity disorder, unspecified type: Secondary | ICD-10-CM | POA: Insufficient documentation

## 2016-08-17 DIAGNOSIS — M25532 Pain in left wrist: Secondary | ICD-10-CM

## 2016-08-17 DIAGNOSIS — F1410 Cocaine abuse, uncomplicated: Secondary | ICD-10-CM

## 2016-08-17 DIAGNOSIS — F1310 Sedative, hypnotic or anxiolytic abuse, uncomplicated: Secondary | ICD-10-CM

## 2016-08-17 DIAGNOSIS — F1910 Other psychoactive substance abuse, uncomplicated: Secondary | ICD-10-CM

## 2016-08-17 DIAGNOSIS — Z79899 Other long term (current) drug therapy: Secondary | ICD-10-CM | POA: Insufficient documentation

## 2016-08-17 DIAGNOSIS — M79671 Pain in right foot: Secondary | ICD-10-CM | POA: Insufficient documentation

## 2016-08-17 DIAGNOSIS — F17210 Nicotine dependence, cigarettes, uncomplicated: Secondary | ICD-10-CM | POA: Insufficient documentation

## 2016-08-17 LAB — RAPID HIV SCREEN (HIV 1/2 AB+AG)
HIV 1/2 Antibodies: NONREACTIVE
HIV-1 P24 ANTIGEN - HIV24: NONREACTIVE

## 2016-08-17 LAB — CBC WITH DIFFERENTIAL/PLATELET
BASOS ABS: 0.1 10*3/uL (ref 0–0.1)
Basophils Relative: 1 %
EOS PCT: 2 %
Eosinophils Absolute: 0.2 10*3/uL (ref 0–0.7)
HCT: 40 % (ref 35.0–47.0)
HEMOGLOBIN: 13.3 g/dL (ref 12.0–16.0)
LYMPHS PCT: 31 %
Lymphs Abs: 4 10*3/uL — ABNORMAL HIGH (ref 1.0–3.6)
MCH: 27.9 pg (ref 26.0–34.0)
MCHC: 33.2 g/dL (ref 32.0–36.0)
MCV: 84 fL (ref 80.0–100.0)
Monocytes Absolute: 1.3 10*3/uL — ABNORMAL HIGH (ref 0.2–0.9)
Monocytes Relative: 10 %
Neutro Abs: 7.4 10*3/uL — ABNORMAL HIGH (ref 1.4–6.5)
Neutrophils Relative %: 56 %
PLATELETS: 557 10*3/uL — AB (ref 150–440)
RBC: 4.77 MIL/uL (ref 3.80–5.20)
RDW: 15.9 % — ABNORMAL HIGH (ref 11.5–14.5)
WBC: 13 10*3/uL — AB (ref 3.6–11.0)

## 2016-08-17 LAB — COMPREHENSIVE METABOLIC PANEL
ALT: 49 U/L (ref 14–54)
AST: 42 U/L — AB (ref 15–41)
Albumin: 4 g/dL (ref 3.5–5.0)
Alkaline Phosphatase: 128 U/L — ABNORMAL HIGH (ref 38–126)
Anion gap: 7 (ref 5–15)
BUN: 12 mg/dL (ref 6–20)
CO2: 26 mmol/L (ref 22–32)
CREATININE: 0.55 mg/dL (ref 0.44–1.00)
Calcium: 9.6 mg/dL (ref 8.9–10.3)
Chloride: 103 mmol/L (ref 101–111)
GFR calc Af Amer: >60 mL/min (ref >60)
GLUCOSE: 102 mg/dL — AB (ref 65–99)
Potassium: 4.4 mmol/L (ref 3.5–5.1)
SODIUM: 136 mmol/L (ref 135–145)
Total Bilirubin: 0.6 mg/dL (ref 0.3–1.2)
Total Protein: 8.8 g/dL — ABNORMAL HIGH (ref 6.5–8.1)

## 2016-08-17 LAB — URINE DRUG SCREEN, QUALITATIVE (ARMC ONLY)
AMPHETAMINES, UR SCREEN: NOT DETECTED
Barbiturates, Ur Screen: NOT DETECTED
Benzodiazepine, Ur Scrn: POSITIVE — AB
COCAINE METABOLITE, UR ~~LOC~~: POSITIVE — AB
Cannabinoid 50 Ng, Ur ~~LOC~~: NOT DETECTED
MDMA (ECSTASY) UR SCREEN: NOT DETECTED
METHADONE SCREEN, URINE: NOT DETECTED
Opiate, Ur Screen: NOT DETECTED
Phencyclidine (PCP) Ur S: NOT DETECTED
TRICYCLIC, UR SCREEN: NOT DETECTED

## 2016-08-17 LAB — HCG, QUANTITATIVE, PREGNANCY: hCG, Beta Chain, Quant, S: <1 m[IU]/mL (ref <5)

## 2016-08-17 MED ORDER — CLINDAMYCIN HCL 150 MG PO CAPS
300.0000 mg | ORAL_CAPSULE | Freq: Once | ORAL | Status: AC
  Administered 2016-08-17: 300 mg via ORAL
  Filled 2016-08-17: qty 2

## 2016-08-17 MED ORDER — CLINDAMYCIN HCL 300 MG PO CAPS: 300.0000 mg | ORAL_CAPSULE | Freq: Three times a day (TID) | ORAL | 0 refills | Status: AC

## 2016-08-17 MED ORDER — HALOPERIDOL LACTATE 5 MG/ML IJ SOLN
10.0000 mg | Freq: Once | INTRAMUSCULAR | Status: AC
  Administered 2016-08-17: 10 mg via INTRAMUSCULAR
  Filled 2016-08-17: qty 2
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
## 2016-08-17 NOTE — ED Notes (Addendum)
ED Provider at bedside, pt tearful talking to MD, pt states she wants to be clean. Per pt she started using again x2wks ago after leaving prison.

## 2016-08-17 NOTE — ED Triage Notes (Signed)
Pt arrives ambulatory to triage with c/o left hand pain. Pt reports using cocaine yesterday AM and missing vein. Pt also has swollen and red feet x2 weeks. Pt is tearful in triage and left hand is swollen.

## 2016-08-17 NOTE — ED Notes (Signed)
Refused wheelchair, ambulatory accompanied by significant other, steady gait.

## 2016-08-17 NOTE — ED Notes (Signed)
Pt sitting up in room, tearful, requesting to go home.  Dr Roxan Hockeyobinson made aware.

## 2016-08-17 NOTE — ED Notes (Addendum)
Pt boyfriend lying on bed with pt, pt appears to be under the influence. Pt eyes are red and has slurred speech noted, pt boyfriend appears to be coherent.

## 2016-08-17 NOTE — Discharge Instructions (Signed)
Keep wrist elevated.  Wear ace bandage for compression and swelling.  Return for worsening, pain, fevers or swelling.

## 2016-08-17 NOTE — ED Notes (Signed)
Patient up and ambulating in room with EDT Gerilyn PilgrimJacob, MD made aware.

## 2016-08-17 NOTE — ED Notes (Signed)
Patient returned from CT, resting in room

## 2016-08-17 NOTE — ED Notes (Signed)
Pt to CT

## 2016-08-17 NOTE — ED Notes (Signed)
Patient sleeping in room, per patient significant other--was able to eat all food from try except lettuce & tomato.

## 2016-08-17 NOTE — ED Notes (Signed)
Pt unable to keep affected hand elevated, pt jerking and unable to lie still while sleeping.

## 2016-12-06 ENCOUNTER — Encounter: Payer: Self-pay | Admitting: Emergency Medicine

## 2016-12-06 DIAGNOSIS — F17210 Nicotine dependence, cigarettes, uncomplicated: Secondary | ICD-10-CM | POA: Insufficient documentation

## 2016-12-06 DIAGNOSIS — L988 Other specified disorders of the skin and subcutaneous tissue: Secondary | ICD-10-CM | POA: Insufficient documentation

## 2016-12-06 NOTE — ED Triage Notes (Signed)
Pt presents with multiple open wounds to bilateral arms and face; pt admits to using Meth 3 days ago, after being clean for 7 months, and has been picking at her skin since; afebrile in triage; pt with 3 large areas to left upper arm-says she rubs the areas until they're raw and then picks; says when her skin gets this bad she just needs an antibiotic

## 2016-12-07 ENCOUNTER — Emergency Department
Admission: EM | Admit: 2016-12-07 | Discharge: 2016-12-07 | Disposition: A | Discharge status: Home / Self Care | Payer: Self-pay | Attending: Emergency Medicine | Admitting: Emergency Medicine

## 2016-12-07 DIAGNOSIS — L089 Local infection of the skin and subcutaneous tissue, unspecified: Secondary | ICD-10-CM

## 2016-12-07 DIAGNOSIS — T148XXA Other injury of unspecified body region, initial encounter: Secondary | ICD-10-CM

## 2016-12-07 HISTORY — DX: Other psychoactive substance abuse, uncomplicated: F19.10

## 2016-12-07 MED ORDER — BACITRACIN ZINC 500 UNIT/GM EX OINT
TOPICAL_OINTMENT | Freq: Once | CUTANEOUS | Status: AC
  Administered 2016-12-07: 1 via TOPICAL
  Filled 2016-12-07: qty 1.8

## 2016-12-07 MED ORDER — SULFAMETHOXAZOLE-TRIMETHOPRIM 800-160 MG PO TABS: 2.0000 | ORAL_TABLET | Freq: Two times a day (BID) | ORAL | 0 refills | Status: AC

## 2016-12-07 MED ORDER — SULFAMETHOXAZOLE-TRIMETHOPRIM 800-160 MG PO TABS
2.0000 | ORAL_TABLET | Freq: Once | ORAL | Status: AC
  Administered 2016-12-07: 2 via ORAL
  Filled 2016-12-07: qty 2
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
## 2016-12-07 NOTE — Discharge Instructions (Signed)
Take antibiotics as prescribed (Bactrim 2 tablets twice daily 10 days). Return to the ER for worsening symptoms, increased redness/swelling, fever, persistent vomiting or other concerns.

## 2017-02-02 ENCOUNTER — Emergency Department (HOSPITAL_COMMUNITY)
Admission: EM | Admit: 2017-02-02 | Discharge: 2017-02-02 | Disposition: A | Discharge status: Home / Self Care | Payer: Self-pay | Attending: Emergency Medicine | Admitting: Emergency Medicine

## 2017-02-02 ENCOUNTER — Encounter (HOSPITAL_COMMUNITY): Payer: Self-pay | Admitting: Emergency Medicine

## 2017-02-02 DIAGNOSIS — Z5321 Procedure and treatment not carried out due to patient leaving prior to being seen by health care provider: Secondary | ICD-10-CM | POA: Insufficient documentation

## 2017-02-02 LAB — URINALYSIS, ROUTINE W REFLEX MICROSCOPIC
BILIRUBIN URINE: NEGATIVE
GLUCOSE, UA: NEGATIVE mg/dL
Hgb urine dipstick: NEGATIVE
KETONES UR: NEGATIVE mg/dL
LEUKOCYTES UA: NEGATIVE
NITRITE: POSITIVE — AB
PROTEIN: 30 mg/dL — AB
Specific Gravity, Urine: 1.033 — ABNORMAL HIGH (ref 1.005–1.030)
WBC UA: NONE SEEN WBC/hpf (ref 0–5)
pH: 5 (ref 5.0–8.0)

## 2017-02-02 LAB — BASIC METABOLIC PANEL
Anion gap: 11 (ref 5–15)
BUN: 10 mg/dL (ref 6–20)
CO2: 22 mmol/L (ref 22–32)
CREATININE: 0.78 mg/dL (ref 0.44–1.00)
Calcium: 9.8 mg/dL (ref 8.9–10.3)
Chloride: 105 mmol/L (ref 101–111)
GFR calc Af Amer: >60 mL/min (ref >60)
GLUCOSE: 80 mg/dL (ref 65–99)
POTASSIUM: 4.2 mmol/L (ref 3.5–5.1)
SODIUM: 138 mmol/L (ref 135–145)

## 2017-02-02 LAB — POC URINE PREG, ED: Preg Test, Ur: NEGATIVE

## 2017-02-02 LAB — CBC
HEMATOCRIT: 41.1 % (ref 36.0–46.0)
Hemoglobin: 13.2 g/dL (ref 12.0–15.0)
MCH: 25.6 pg — ABNORMAL LOW (ref 26.0–34.0)
MCHC: 32.1 g/dL (ref 30.0–36.0)
MCV: 79.7 fL (ref 78.0–100.0)
PLATELETS: 532 10*3/uL — AB (ref 150–400)
RBC: 5.16 MIL/uL — ABNORMAL HIGH (ref 3.87–5.11)
RDW: 14.1 % (ref 11.5–15.5)
WBC: 7.9 10*3/uL (ref 4.0–10.5)

## 2017-02-02 MED ORDER — OXYCODONE-ACETAMINOPHEN 5-325 MG PO TABS
ORAL_TABLET | ORAL | Status: AC
  Filled 2017-02-02: qty 1

## 2017-02-02 MED ORDER — OXYCODONE-ACETAMINOPHEN 5-325 MG PO TABS: 1.0000 | ORAL_TABLET | ORAL | Status: DC | PRN

## 2017-02-02 NOTE — ED Triage Notes (Signed)
Pt to ER for evaluation of left flank pain onset 2 weeks ago with gradual worsening and in the last 3 days has become intermittently unbearable. Denies burning, stinging, but states severe left flank discomfort with urination. States nausea when pain spikes. CVA tenderness to left flank noted. A/o x4.

## 2017-02-02 NOTE — ED Notes (Signed)
Called to check Pt's Vitals. No Answer x's 3.

## 2017-02-02 NOTE — ED Notes (Signed)
Called to take Pt back to RM. Pt did not answer x's 3.

## 2017-11-06 DIAGNOSIS — T401X1A Poisoning by heroin, accidental (unintentional), initial encounter: Secondary | ICD-10-CM

## 2017-11-07 ENCOUNTER — Inpatient Hospital Stay
Admit: 2017-11-07 | Discharge: 2017-11-07 | Disposition: A | Discharge status: Home / Self Care | Payer: Self-pay | Attending: Emergency Medicine

## 2017-11-07 MED ORDER — ONDANSETRON (PF) 4 MG/2 ML INJECTION
4 mg/2 mL | INTRAMUSCULAR | Status: AC
Start: 2017-11-07 — End: 2017-11-07
  Administered 2017-11-07: 04:00:00 via INTRAVENOUS

## 2017-11-07 MED ORDER — SODIUM CHLORIDE 0.9% BOLUS IV
0.9 % | Freq: Once | INTRAVENOUS | Status: AC
Start: 2017-11-07 — End: 2017-11-07
  Administered 2017-11-07: 04:00:00 via INTRAVENOUS

## 2017-11-07 MED FILL — ONDANSETRON (PF) 4 MG/2 ML INJECTION: 4 mg/2 mL | INTRAMUSCULAR | Qty: 2

## 2017-11-07 MED FILL — SODIUM CHLORIDE 0.9 % IV: INTRAVENOUS | Qty: 1000

## 2017-11-07 NOTE — ED Notes (Signed)
Per EMS, pt was found unresponsive and cyanotic with bounding pulse. Pt was given 2mg  narcan and came around.  Per pt's boyfriend they were trying to score crack cocaine when they ended up with heroine instead. She dropped almost immediately after snorting the heroine. The boyfriend states he was doing CPR.  Pt currently very anxious and c/o chest pain. Pt admits to having past drug problem.

## 2017-11-07 NOTE — ED Provider Notes (Signed)
EMERGENCY DEPARTMENT HISTORY AND PHYSICAL EXAM    Date: 11/06/2017  Patient Name: Charlene Turner    History of Presenting Illness     Chief Complaint   Patient presents with   ??? Drug Overdose       History Provided By: Patient and EMS    Additional History (Context):   Charlene Turner is a 32 y.o. female with PMHX significant for heroin abuse presents to the emergency department after being found unresponsive, cyanotic.  EMS report that she has been abstaining from heroin however "relapsed" today.  She is unsure what she took or how much she took.  EMS gave her 2 mg of Narcan at which time patient became awake, alert, spontaneously breathing.  And reports feeling anxious.  She also reports nausea.  Denies suicidal ideation.  Pt denies current chest pain, shortness of breath, abdominal pain, and any other sxs or complaints.     PCP: None        Past History     Past Medical History:  Past Medical History:   Diagnosis Date   ??? Psychiatric disorder     PTSD, ADHD, depression, anxiety       Past Surgical History:  No past surgical history on file.    Family History:  No family history on file.    Social History:  Social History     Tobacco Use   ??? Smoking status: Not on file   Substance Use Topics   ??? Alcohol use: Not on file   ??? Drug use: Yes     Types: Heroin, Cocaine       Allergies:  Allergies   Allergen Reactions   ??? Amoxicillin Anaphylaxis   ??? Penicillins Anaphylaxis   ??? Azithromycin Hives         Review of Systems   Review of Systems   Constitutional: Negative for chills and fever.   HENT: Negative for congestion, ear pain, sinus pain and sore throat.    Eyes: Negative for pain and visual disturbance.   Respiratory: Negative for cough and shortness of breath.    Cardiovascular: Negative for chest pain and leg swelling.   Gastrointestinal: Positive for nausea. Negative for abdominal pain, constipation, diarrhea and vomiting.   Genitourinary: Negative for dysuria, hematuria, vaginal bleeding and vaginal discharge.    Musculoskeletal: Negative for back pain and neck pain.   Skin: Negative for rash and wound.   Neurological: Negative for dizziness, tremors, weakness, light-headedness and numbness.   All other systems reviewed and are negative.      Physical Exam     Vitals:    11/07/17 0000   BP: 131/83   Pulse: (!) 123   Resp: 24   Temp: 99.2 ??F (37.3 ??C)   SpO2: 100%   Weight: 68 kg (150 lb)   Height: 5\' 2"  (1.575 m)     Physical Exam    Nursing note and vitals reviewed    Constitutional: Young Caucasian female, appears older than stated age, anxious, tearful  Head: Normocephalic, Atraumatic  Eyes: Pupils are equal, round, and reactive to light, EOMI  Neck: Supple, non-tender  Cardiovascular: Tachycardic, no murmurs, rubs, or gallops  Chest: Normal work of breathing and chest excursion bilaterally  Lungs: Clear to ausculation bilaterally, no wheezes, no rhonchi  Abdomen: Soft, non tender, non distended, normoactive bowel sounds  Back: No evidence of trauma or deformity  Extremities: No evidence of trauma or deformity, no LE edema  Skin: Warm and dry, normal cap  refill  Neuro: Alert and appropriate, CN intact, normal speech, moving all 4 extremities freely and symmetrically       Diagnostic Study Results     Labs -     Recent Results (from the past 12 hour(s))   EKG, 12 LEAD, INITIAL    Collection Time: 11/07/17  1:24 AM   Result Value Ref Range    Ventricular Rate 98 BPM    Atrial Rate 98 BPM    P-R Interval 132 ms    QRS Duration 84 ms    Q-T Interval 360 ms    QTC Calculation (Bezet) 459 ms    Calculated P Axis 57 degrees    Calculated R Axis 24 degrees    Calculated T Axis 31 degrees    Diagnosis       Normal sinus rhythm  Normal ECG  No previous ECGs available         Radiologic Studies -   No orders to display     CT Results  (Last 48 hours)    None        CXR Results  (Last 48 hours)    None            Medical Decision Making   I am the first provider for this patient.    I reviewed the vital signs, available nursing  notes, past medical history, past surgical history, family history and social history.    Vital Signs-Reviewed the patient's vital signs.    EKG interpretation: (Preliminary)  1:36 AM   Normal sinus rhythm at 98 bpm.  QTc 459 ms.  No acute ST elevation    Records Reviewed: Nursing Notes and Old Medical Records    Provider Notes:   32 y.o. female with a history of heroin abuse presenting after likely heroin overdose.  Was found unresponsive, cyanotic.  Given 2 mg of Narcan.  On arrival, awake, alert, anxious, tearful.  Tachycardic however saturating well.  Will obtain EKG although ACS very low on my differential.  Will observe in the emergency department for rebound symptoms.    Procedures:  Procedures    ED Course:   12:05 AM   Initial assessment performed. The patients presenting problems have been discussed, and they are in agreement with the care plan formulated and outlined with them.  I have encouraged them to ask questions as they arise throughout their visit.    1:54 AM  Patient observed in the emergency department for 2 hours.  Heart rate improved.         Diagnosis and Disposition       DISCHARGE NOTE:  1:54 AM    Charlene Turner's  results have been reviewed with her.  She has been counseled regarding her diagnosis, treatment, and plan.  She verbally conveys understanding and agreement of the signs, symptoms, diagnosis, treatment and prognosis and additionally agrees to follow up as discussed.  She also agrees with the care-plan and conveys that all of her questions have been answered.  I have also provided discharge instructions for her that include: educational information regarding their diagnosis and treatment, and list of reasons why they would want to return to the ED prior to their follow-up appointment, should her condition change. She has been provided with education for proper emergency department utilization.     CLINICAL IMPRESSION:    1. Accidental overdose of heroin, initial encounter (HCC)    2.  Heroin abuse (HCC)  PLAN:  1. D/C Home  2. There are no discharge medications for this patient.    3.   Follow-up Information     Follow up With Specialties Details Why Contact Info    [place]  Schedule an appointment as soon as possible for a visit in 2 days  [place], [place]    [facility] EMERGENCY DEPT Emergency Medicine  As needed if symptoms worsen [address]  [place] [address]  [phone]        ____________________________________     Please note that this dictation was completed with Dragon, the computer voice recognition software.  Quite often unanticipated grammatical, syntax, homophones, and other interpretive errors are inadvertently transcribed by the computer software.  Please disregard these errors.  Please excuse any errors that have escaped final proofreading.

## 2017-11-07 NOTE — ED Provider Notes (Signed)
EMERGENCY DEPARTMENT HISTORY AND PHYSICAL EXAM    Date: 11/06/2017  Patient Name: Charlene Turner    History of Presenting Illness     Chief Complaint   Patient presents with   ??? Drug Overdose       History Provided By: Patient and EMS    Additional History (Context):   Dariane Lyvers is a 32 y.o. female with PMHX significant for heroin abuse presents to the emergency department after being found unresponsive, cyanotic.  EMS report that she has been abstaining from heroin however "relapsed" today.  She is unsure what she took or how much she took.  EMS gave her 2 mg of Narcan at which time patient became awake, alert, spontaneously breathing.  And reports feeling anxious.  She also reports nausea.  Denies suicidal ideation.  Pt denies current chest pain, shortness of breath, abdominal pain, and any other sxs or complaints.     PCP: None        Past History     Past Medical History:  Past Medical History:   Diagnosis Date   ??? Psychiatric disorder     PTSD, ADHD, depression, anxiety       Past Surgical History:  No past surgical history on file.    Family History:  No family history on file.    Social History:  Social History     Tobacco Use   ??? Smoking status: Not on file   Substance Use Topics   ??? Alcohol use: Not on file   ??? Drug use: Yes     Types: Heroin, Cocaine       Allergies:  Allergies   Allergen Reactions   ??? Amoxicillin Anaphylaxis   ??? Penicillins Anaphylaxis   ??? Azithromycin Hives         Review of Systems   Review of Systems   Constitutional: Negative for chills and fever.   HENT: Negative for congestion, ear pain, sinus pain and sore throat.    Eyes: Negative for pain and visual disturbance.   Respiratory: Negative for cough and shortness of breath.    Cardiovascular: Negative for chest pain and leg swelling.   Gastrointestinal: Positive for nausea. Negative for abdominal pain, constipation, diarrhea and vomiting.   Genitourinary: Negative for dysuria, hematuria, vaginal bleeding and vaginal discharge.    Musculoskeletal: Negative for back pain and neck pain.   Skin: Negative for rash and wound.   Neurological: Negative for dizziness, tremors, weakness, light-headedness and numbness.   All other systems reviewed and are negative.      Physical Exam     Vitals:    11/07/17 0000   BP: 131/83   Pulse: (!) 123   Resp: 24   Temp: 99.2 ??F (37.3 ??C)   SpO2: 100%   Weight: 68 kg (150 lb)   Height: 5\' 2"  (1.575 m)     Physical Exam    Nursing note and vitals reviewed    Constitutional: Young Caucasian female, appears older than stated age, anxious, tearful  Head: Normocephalic, Atraumatic  Eyes: Pupils are equal, round, and reactive to light, EOMI  Neck: Supple, non-tender  Cardiovascular: Tachycardic, no murmurs, rubs, or gallops  Chest: Normal work of breathing and chest excursion bilaterally  Lungs: Clear to ausculation bilaterally, no wheezes, no rhonchi  Abdomen: Soft, non tender, non distended, normoactive bowel sounds  Back: No evidence of trauma or deformity  Extremities: No evidence of trauma or deformity, no LE edema  Skin: Warm and dry, normal cap  refill  Neuro: Alert and appropriate, CN intact, normal speech, moving all 4 extremities freely and symmetrically       Diagnostic Study Results     Labs -     Recent Results (from the past 12 hour(s))   EKG, 12 LEAD, INITIAL    Collection Time: 11/07/17  1:24 AM   Result Value Ref Range    Ventricular Rate 98 BPM    Atrial Rate 98 BPM    P-R Interval 132 ms    QRS Duration 84 ms    Q-T Interval 360 ms    QTC Calculation (Bezet) 459 ms    Calculated P Axis 57 degrees    Calculated R Axis 24 degrees    Calculated T Axis 31 degrees    Diagnosis       Normal sinus rhythm  Normal ECG  No previous ECGs available         Radiologic Studies -   No orders to display     CT Results  (Last 48 hours)    None        CXR Results  (Last 48 hours)    None            Medical Decision Making   I am the first provider for this patient.     I reviewed the vital signs, available nursing notes, past medical history, past surgical history, family history and social history.    Vital Signs-Reviewed the patient's vital signs.    EKG interpretation: (Preliminary)  1:36 AM   Normal sinus rhythm at 98 bpm.  QTc 459 ms.  No acute ST elevation    Records Reviewed: Nursing Notes and Old Medical Records    Provider Notes:   32 y.o. female with a history of heroin abuse presenting after likely heroin overdose.  Was found unresponsive, cyanotic.  Given 2 mg of Narcan.  On arrival, awake, alert, anxious, tearful.  Tachycardic however saturating well.  Will obtain EKG although ACS very low on my differential.  Will observe in the emergency department for rebound symptoms.    Procedures:  Procedures    ED Course:   12:05 AM   Initial assessment performed. The patients presenting problems have been discussed, and they are in agreement with the care plan formulated and outlined with them.  I have encouraged them to ask questions as they arise throughout their visit.    1:54 AM  Patient observed in the emergency department for 2 hours.  Heart rate improved.         Diagnosis and Disposition       DISCHARGE NOTE:  1:54 AM    Labresha Buysse's  results have been reviewed with her.  She has been counseled regarding her diagnosis, treatment, and plan.  She verbally conveys understanding and agreement of the signs, symptoms, diagnosis, treatment and prognosis and additionally agrees to follow up as discussed.  She also agrees with the care-plan and conveys that all of her questions have been answered.  I have also provided discharge instructions for her that include: educational information regarding their diagnosis and treatment, and list of reasons why they would want to return to the ED prior to their follow-up appointment, should her condition change. She has been provided with education for proper emergency department utilization.     CLINICAL IMPRESSION:     1. Accidental overdose of heroin, initial encounter (HCC)    2. Heroin abuse (HCC)  PLAN:  1. D/C Home  2. There are no discharge medications for this patient.    3.   Follow-up Information     Follow up With Specialties Details Why Contact Info    [place]  Schedule an appointment as soon as possible for a visit in 2 days  [place], [place]    [facility] EMERGENCY DEPT Emergency Medicine  As needed if symptoms worsen [address]  [place] [address]  [phone]        ____________________________________     Please note that this dictation was completed with Dragon, the computer voice recognition software.  Quite often unanticipated grammatical, syntax, homophones, and other interpretive errors are inadvertently transcribed by the computer software.  Please disregard these errors.  Please excuse any errors that have escaped final proofreading.

## 2017-11-07 NOTE — ED Triage Notes (Signed)
Per EMS, pt was found unresponsive and cyanotic with bounding pulse. Pt was given 2mg narcan and came around.  Per pt's boyfriend they were trying to score crack cocaine when they ended up with heroine instead. She dropped almost immediately after snorting the heroine. The boyfriend states he was doing CPR.  Pt currently very anxious and c/o chest pain. Pt admits to having past drug problem.

## 2017-11-10 LAB — EKG, 12 LEAD, INITIAL
Atrial Rate: 98 {beats}/min
Calculated P Axis: 57 degrees
Calculated R Axis: 24 degrees
Calculated T Axis: 31 degrees
Diagnosis: NORMAL
P-R Interval: 132 ms
Q-T Interval: 360 ms
QRS Duration: 84 ms
QTC Calculation (Bezet): 459 ms
Ventricular Rate: 98 {beats}/min

## 2017-11-10 LAB — EKG 12-LEAD
Atrial Rate: 98 {beats}/min
Diagnosis: NORMAL
P Axis: 57 degrees
P-R Interval: 132 ms
Q-T Interval: 360 ms
QRS Duration: 84 ms
QTc Calculation (Bazett): 459 ms
R Axis: 24 degrees
T Axis: 31 degrees
Ventricular Rate: 98 {beats}/min

## 2018-01-16 IMAGING — CT CT HEAD W/O CM
3 series · 15 of 46 positions shown, 18 images · non-contrast
Comparison: None.

CLINICAL DATA: Acute encephalopathy.

EXAM:
CT HEAD WITHOUT CONTRAST
TECHNIQUE: Contiguous axial images were obtained from the base of the skull
through the vertex without intravenous contrast.

[Series 2: ax head wo · axial · 0.38mm/px · z∈[-150,-41]mm · 9 of 29 slices shown, 12 images]
[im 3/29  brain]
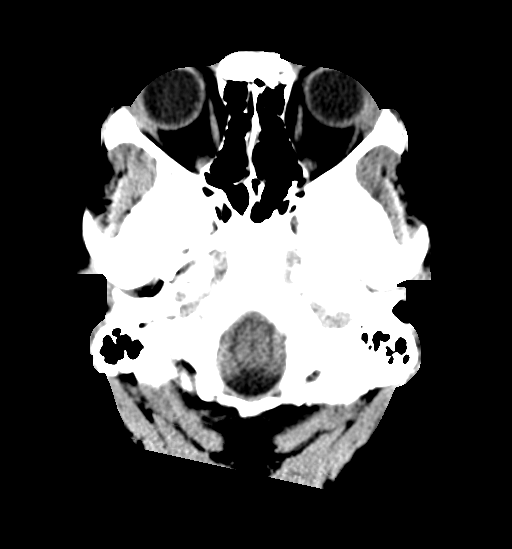
[im 3/29  bone]
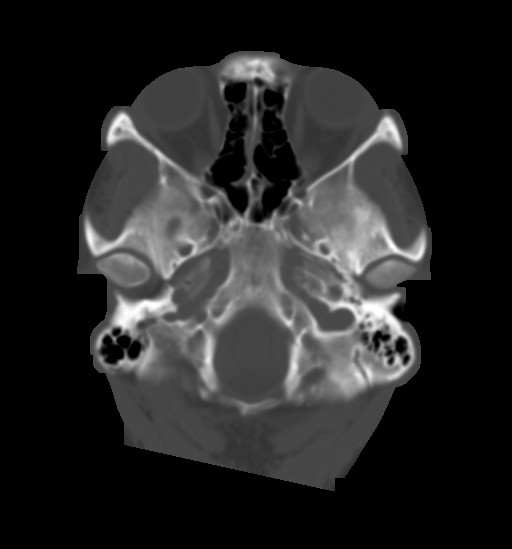
[im 6/29  brain]
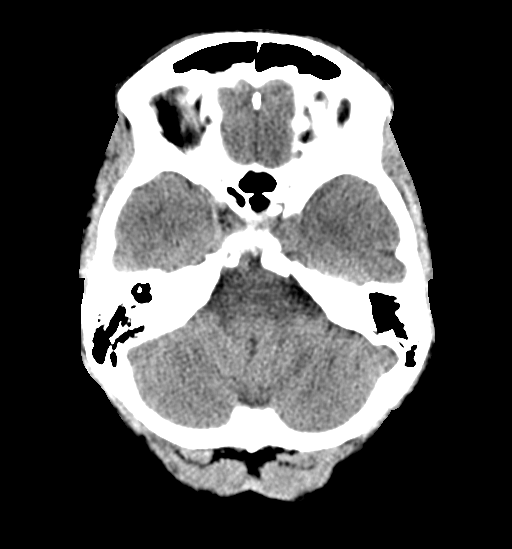
[im 9/29  brain]
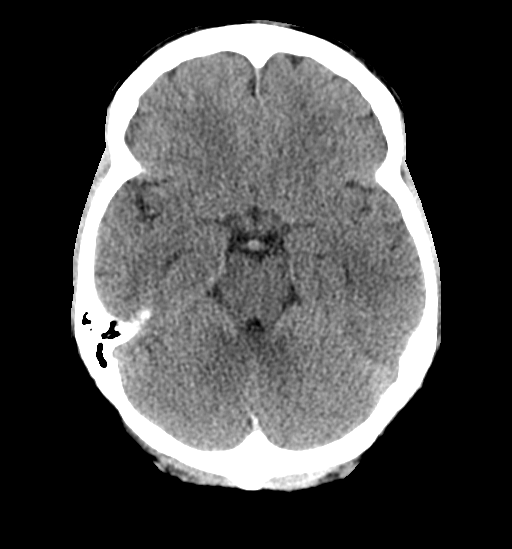
[im 12/29  brain]
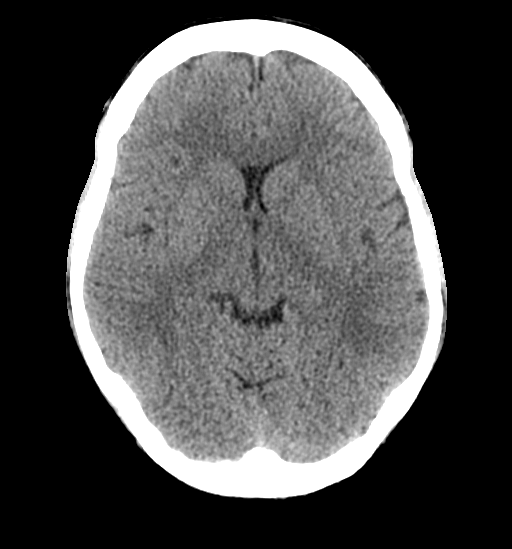
[im 15/29  brain]
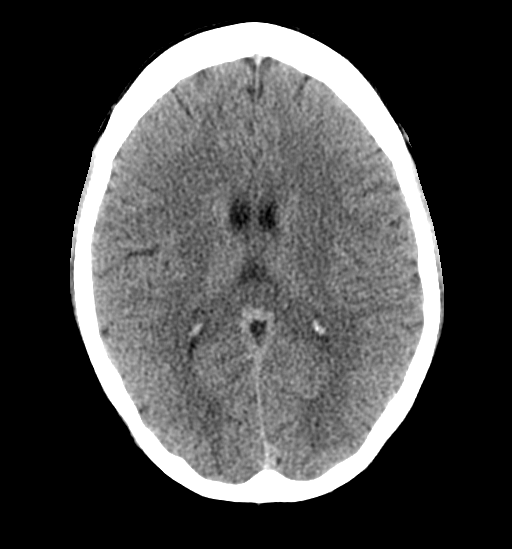
[im 15/29  bone]
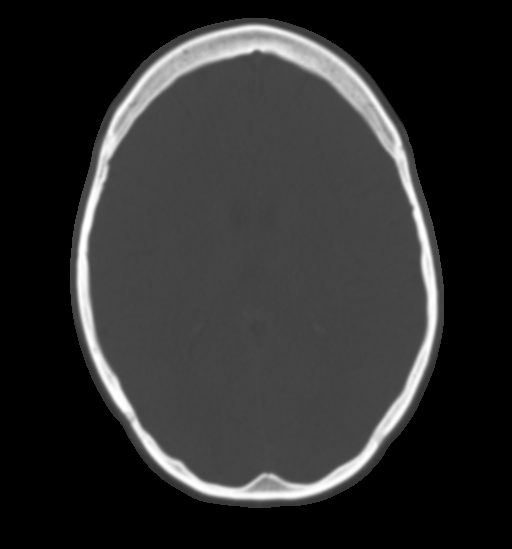
[im 18/29  brain]
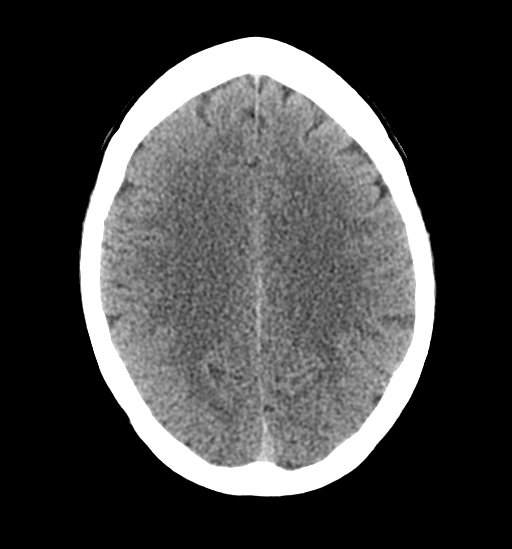
[im 21/29  brain]
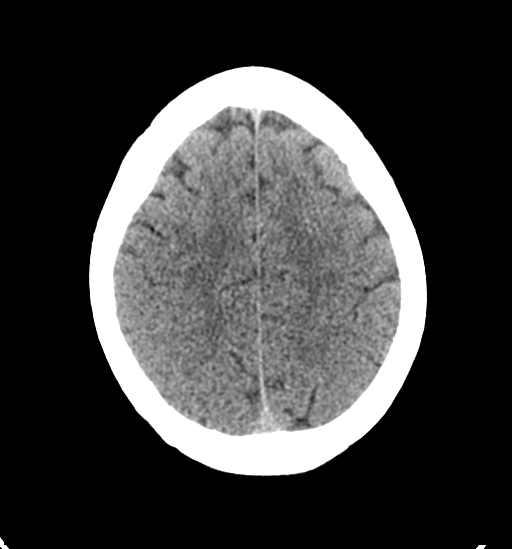
[im 24/29  brain]
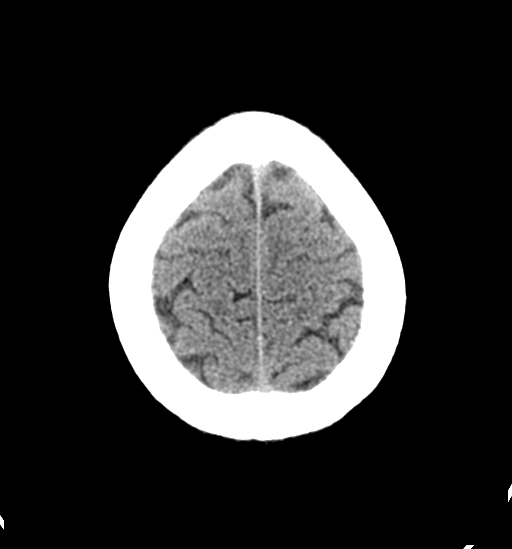
[im 27/29  brain]
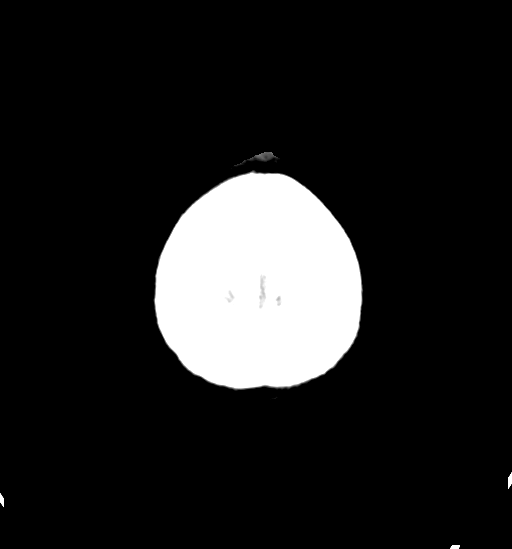
[im 27/29  bone]
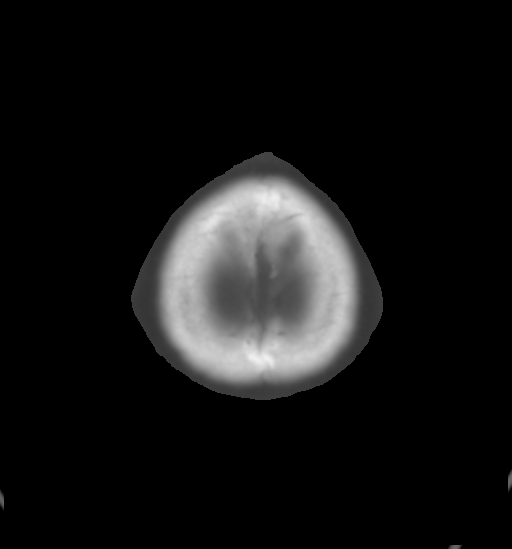

[Series 5: coronal soft tissue · coronal · 0.28mm/px · 3 of 65 slices shown]
[im 22/65  brain]
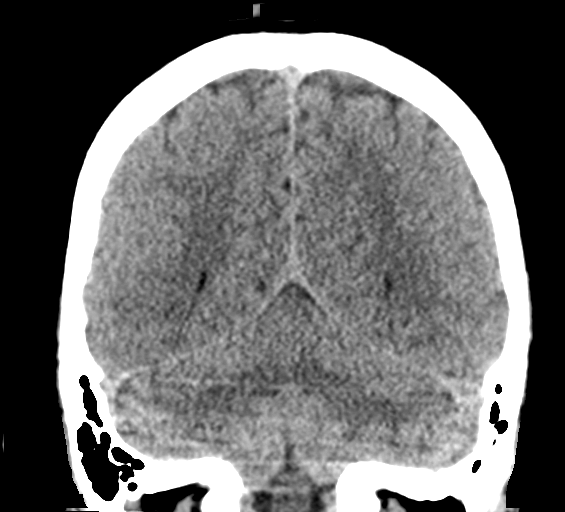
[im 29/65  brain]
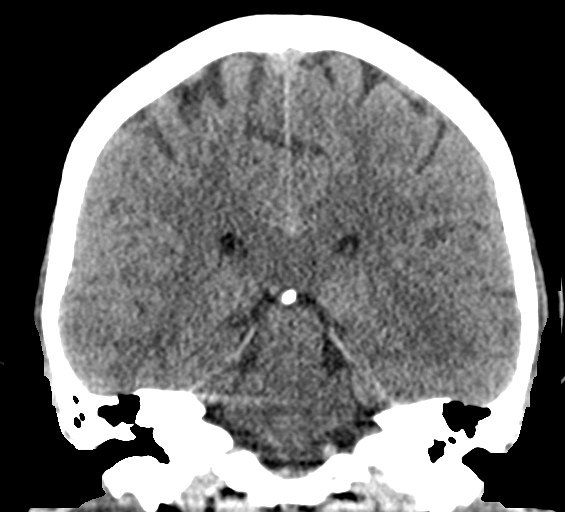
[im 36/65  brain]
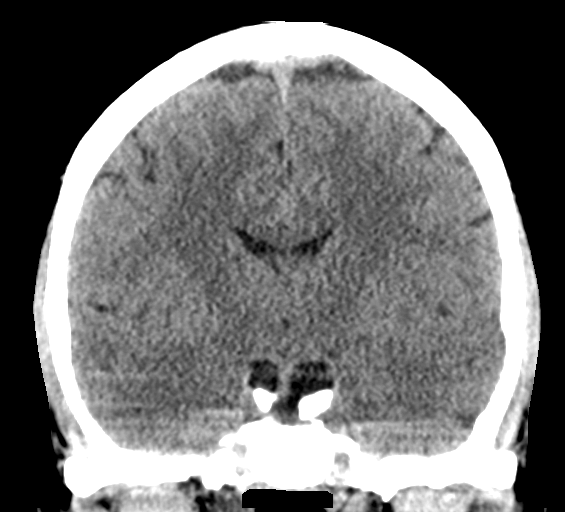

[Series 6: sagittal soft tissue · sagittal · 0.28mm/px · 3 of 54 slices shown]
[im 18/54  brain]
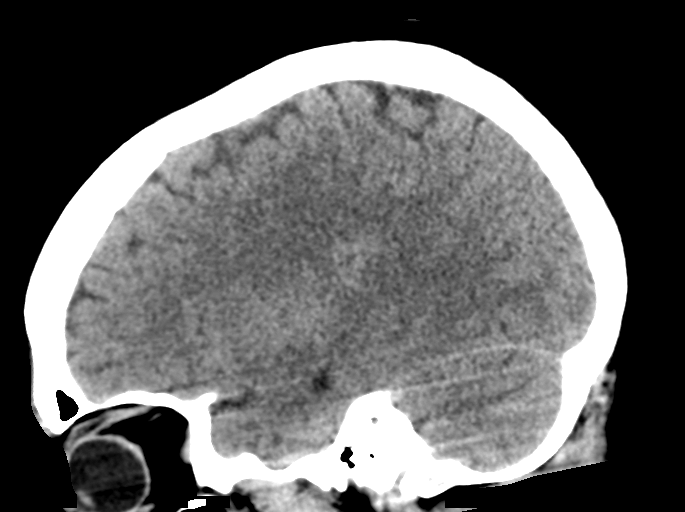
[im 27/54  brain]
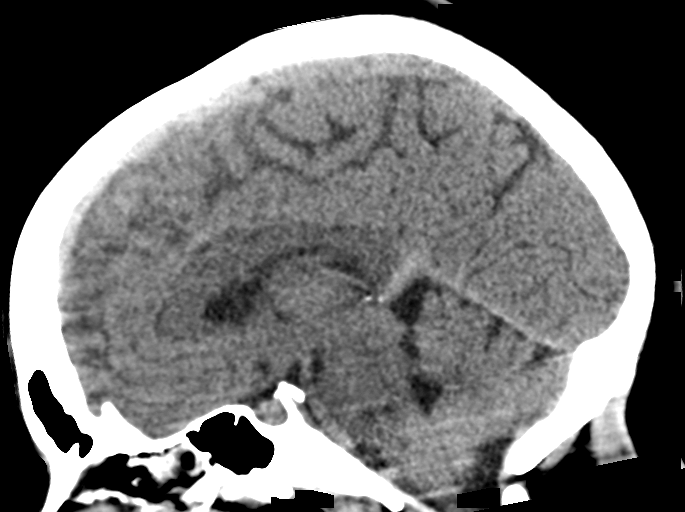
[im 36/54  brain]
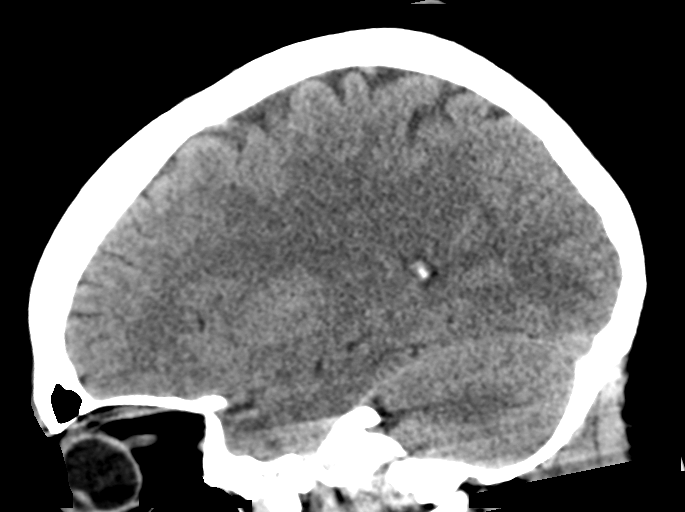

[15 of 46 positions shown; findings below may reference images not displayed]

FINDINGS: Brain: No evidence of parenchymal hemorrhage or extra-axial fluid
collection. No mass lesion, mass effect, or midline shift. No CT
evidence of acute infarction. Cerebral volume is age appropriate. No
ventriculomegaly.

Vascular: No hyperdense vessel or unexpected calcification.

Skull: No evidence of calvarial fracture.

Sinuses/Orbits: The visualized paranasal sinuses are essentially
clear.

Other:  The mastoid air cells are unopacified.
IMPRESSION: Negative head CT.  No evidence of acute intracranial abnormality.
